# Patient Record
Sex: Female | Born: 1965 | Race: White | Hispanic: No | Marital: Single
Health system: Southern US, Community
[De-identification: ages and names within clinical notes are randomized; demographics above are authoritative.]

## PROBLEM LIST (undated history)

## (undated) DIAGNOSIS — J329 Chronic sinusitis, unspecified: Secondary | ICD-10-CM

## (undated) DIAGNOSIS — F17200 Nicotine dependence, unspecified, uncomplicated: Secondary | ICD-10-CM

## (undated) DIAGNOSIS — F419 Anxiety disorder, unspecified: Secondary | ICD-10-CM

## (undated) DIAGNOSIS — F32A Depression, unspecified: Secondary | ICD-10-CM

## (undated) DIAGNOSIS — F329 Major depressive disorder, single episode, unspecified: Secondary | ICD-10-CM

## (undated) HISTORY — PX: COLONOSCOPY: SHX174

## (undated) HISTORY — DX: Nicotine dependence, unspecified, uncomplicated: F17.200

## (undated) HISTORY — DX: Chronic sinusitis, unspecified: J32.9

## (undated) HISTORY — DX: Anxiety disorder, unspecified: F41.9

## (undated) HISTORY — DX: Major depressive disorder, single episode, unspecified: F32.9

---
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
## 2001-09-11 ENCOUNTER — Encounter: Admission: RE | Admit: 2001-09-11 | Discharge: 2001-09-11 | Payer: Self-pay | Admitting: Otolaryngology

## 2001-09-11 ENCOUNTER — Encounter: Payer: Self-pay | Admitting: Otolaryngology

## 2004-06-24 ENCOUNTER — Ambulatory Visit: Payer: Self-pay | Admitting: Internal Medicine

## 2006-02-01 ENCOUNTER — Ambulatory Visit: Payer: Self-pay | Admitting: Internal Medicine

## 2006-05-05 ENCOUNTER — Ambulatory Visit: Payer: Self-pay | Admitting: Internal Medicine

## 2006-05-05 DIAGNOSIS — F17200 Nicotine dependence, unspecified, uncomplicated: Secondary | ICD-10-CM | POA: Insufficient documentation

## 2006-05-05 DIAGNOSIS — F418 Other specified anxiety disorders: Secondary | ICD-10-CM | POA: Insufficient documentation

## 2006-05-05 HISTORY — DX: Other specified anxiety disorders: F41.8

## 2006-12-26 ENCOUNTER — Ambulatory Visit: Payer: Self-pay | Admitting: Internal Medicine

## 2006-12-26 ENCOUNTER — Telehealth: Payer: Self-pay | Admitting: Internal Medicine

## 2006-12-26 LAB — CONVERTED CEMR LAB: Rapid Strep: NEGATIVE

## 2007-01-03 ENCOUNTER — Ambulatory Visit: Payer: Self-pay | Admitting: Internal Medicine

## 2007-03-08 ENCOUNTER — Ambulatory Visit: Payer: Self-pay | Admitting: Internal Medicine

## 2007-05-04 ENCOUNTER — Telehealth: Payer: Self-pay | Admitting: Internal Medicine

## 2007-07-02 ENCOUNTER — Telehealth: Payer: Self-pay | Admitting: Internal Medicine

## 2007-07-10 ENCOUNTER — Telehealth: Payer: Self-pay | Admitting: Internal Medicine

## 2008-01-18 ENCOUNTER — Telehealth: Payer: Self-pay | Admitting: Internal Medicine

## 2008-01-18 ENCOUNTER — Ambulatory Visit: Payer: Self-pay | Admitting: Internal Medicine

## 2008-03-03 ENCOUNTER — Ambulatory Visit: Payer: Self-pay | Admitting: Family Medicine

## 2008-03-05 ENCOUNTER — Telehealth: Payer: Self-pay | Admitting: Internal Medicine

## 2011-01-04 ENCOUNTER — Ambulatory Visit (INDEPENDENT_AMBULATORY_CARE_PROVIDER_SITE_OTHER): Payer: BC Managed Care – PPO | Admitting: Family Medicine

## 2011-01-04 ENCOUNTER — Encounter: Payer: Self-pay | Admitting: Family Medicine

## 2011-01-04 VITALS — BP 138/96 | HR 80 | Temp 99.4°F | Wt 116.0 lb

## 2011-01-04 DIAGNOSIS — I10 Essential (primary) hypertension: Secondary | ICD-10-CM

## 2011-01-04 MED ORDER — LISINOPRIL-HYDROCHLOROTHIAZIDE 20-25 MG PO TABS: 1.0000 | ORAL_TABLET | Freq: Every day | ORAL | Status: DC

## 2011-01-04 NOTE — Progress Notes (Signed)
  Subjective:    Patient ID: Lori Barrett, female    DOB: 1965/12/06, 45 y.o.   MRN: [id number]  HPI Here to re-establish after living in [place] for the last 2 and 1/2 years. She feels fine but her BP has been slightly high for the past 6 months. She still smokes. She exercises regularly.    Review of Systems  Constitutional: Negative.   Respiratory: Negative.   Cardiovascular: Negative.        Objective:   Physical Exam  Constitutional: She appears well-developed and well-nourished.  Neck: No thyromegaly present.  Cardiovascular: Normal rate, regular rhythm, normal heart sounds and intact distal pulses.   Pulmonary/Chest: Effort normal and breath sounds normal.  Lymphadenopathy:    She has no cervical adenopathy.          Assessment & Plan:  Change from Enalapril to Lisinopril HCT. Advised her to quit smoking. Recheck in a month

## 2011-01-25 ENCOUNTER — Encounter: Payer: Self-pay | Admitting: Family Medicine

## 2011-01-25 ENCOUNTER — Ambulatory Visit (INDEPENDENT_AMBULATORY_CARE_PROVIDER_SITE_OTHER): Payer: BC Managed Care – PPO | Admitting: Family Medicine

## 2011-01-25 VITALS — BP 130/90 | HR 103 | Temp 98.5°F | Wt 117.0 lb

## 2011-01-25 DIAGNOSIS — F341 Dysthymic disorder: Secondary | ICD-10-CM

## 2011-01-25 DIAGNOSIS — Z23 Encounter for immunization: Secondary | ICD-10-CM

## 2011-01-25 DIAGNOSIS — I10 Essential (primary) hypertension: Secondary | ICD-10-CM

## 2011-01-25 DIAGNOSIS — F418 Other specified anxiety disorders: Secondary | ICD-10-CM

## 2011-01-25 MED ORDER — ESCITALOPRAM OXALATE 10 MG PO TABS: 10.0000 mg | ORAL_TABLET | Freq: Every day | ORAL | Status: DC

## 2011-01-25 MED ORDER — ALPRAZOLAM 0.5 MG PO TABS: 0.5000 mg | ORAL_TABLET | Freq: Three times a day (TID) | ORAL | Status: AC | PRN

## 2011-01-25 NOTE — Progress Notes (Signed)
  Subjective:    Patient ID: Lori Barrett, female    DOB: Jul 04, 1965, 45 y.o.   MRN: [id number]  HPI Here asking for help with depression and anxiety. One of her best friends was recently diagnosed with terminal cancer, and she is having a tough time dealing with this. In addition, she recently quit her job and is trying to find more work. She feels tearful and sad at times, often overwhelmed. She can't sleep but her appetite is preserved. She did well with Lexapro a few years ago.    Review of Systems  Constitutional: Negative.   Psychiatric/Behavioral: Positive for sleep disturbance, dysphoric mood and decreased concentration. Negative for confusion. The patient is nervous/anxious.        Objective:   Physical Exam  Constitutional: She appears well-developed and well-nourished.  Cardiovascular: Normal rate, regular rhythm, normal heart sounds and intact distal pulses.   Pulmonary/Chest: Effort normal and breath sounds normal.  Psychiatric: Her behavior is normal. Thought content normal.       Tearful           Assessment & Plan:  Start on daily Lexapro and use Xanax on a prn basis. Her HTN is better controlled now. Recheck in 2 weeks

## 2011-02-02 ENCOUNTER — Telehealth: Payer: Self-pay | Admitting: *Deleted

## 2011-02-02 NOTE — Telephone Encounter (Signed)
Pt feels a little better, but is still having panic attacks when she wakes up in the night.  Also, has abdominal discomfort, and nausea.  Has history of Peptic Ulcer.  Not sleeping well.  Suggestions?

## 2011-02-03 MED ORDER — OMEPRAZOLE MAGNESIUM 20 MG PO TBEC: 20.0000 mg | DELAYED_RELEASE_TABLET | Freq: Every day | ORAL | Status: DC

## 2011-02-03 NOTE — Telephone Encounter (Signed)
Try Prilosec OTC daily

## 2011-02-03 NOTE — Telephone Encounter (Signed)
Notified pt. 

## 2011-02-04 ENCOUNTER — Ambulatory Visit: Payer: BC Managed Care – PPO | Admitting: Family Medicine

## 2011-03-25 ENCOUNTER — Other Ambulatory Visit: Payer: Self-pay | Admitting: Family Medicine

## 2011-03-25 NOTE — Telephone Encounter (Signed)
Script sent e-scribe 

## 2011-03-31 ENCOUNTER — Telehealth: Payer: Self-pay

## 2011-03-31 NOTE — Telephone Encounter (Signed)
Pt states she is breaking out in hives from her buttocks down the back of her legs.  Pt states she did not use the detergent she usually uses last night in her laudry.  Pt has gotten Arm and Hammer for sensitive skin but would like to know what she can take during the day for hives. Pt has been taking Benadryl at night. Pt offered an appt but wants to wait until the beginning of next week to see if there are any improvements.    Per Dr. Clent Ridges pt can take Zyrtec bid (morning and evening) and Benadryl at night.  Pt is aware and will call on Monday to report how she is feeling.

## 2011-08-15 ENCOUNTER — Telehealth: Payer: Self-pay | Admitting: Family Medicine

## 2011-08-15 MED ORDER — LISINOPRIL-HYDROCHLOROTHIAZIDE 20-25 MG PO TABS: 1.0000 | ORAL_TABLET | Freq: Every day | ORAL | Status: DC

## 2011-08-15 NOTE — Telephone Encounter (Signed)
Refill request for Lexapro 10 mg take 1 po qd and pt last here on 01/25/11. ( also a 90 day supply )

## 2011-08-15 NOTE — Telephone Encounter (Signed)
Pt requested a 90 day supply of Lisinopril-HCTZ and I did send e-scribe.

## 2011-08-16 MED ORDER — ESCITALOPRAM OXALATE 10 MG PO TABS: 10.0000 mg | ORAL_TABLET | Freq: Every day | ORAL | Status: DC

## 2011-08-16 NOTE — Telephone Encounter (Signed)
I sent script e-scribe. 

## 2011-08-16 NOTE — Telephone Encounter (Signed)
Send in one year supply of both

## 2012-01-25 ENCOUNTER — Encounter: Payer: Self-pay | Admitting: Family

## 2012-01-25 ENCOUNTER — Ambulatory Visit (INDEPENDENT_AMBULATORY_CARE_PROVIDER_SITE_OTHER): Payer: BC Managed Care – PPO | Admitting: Family

## 2012-01-25 ENCOUNTER — Telehealth: Payer: Self-pay | Admitting: Family Medicine

## 2012-01-25 VITALS — BP 140/98 | HR 85 | Wt 126.0 lb

## 2012-01-25 DIAGNOSIS — H60399 Other infective otitis externa, unspecified ear: Secondary | ICD-10-CM

## 2012-01-25 DIAGNOSIS — I10 Essential (primary) hypertension: Secondary | ICD-10-CM

## 2012-01-25 DIAGNOSIS — H6010 Cellulitis of external ear, unspecified ear: Secondary | ICD-10-CM

## 2012-01-25 MED ORDER — DOXYCYCLINE HYCLATE 100 MG PO TABS: 100.0000 mg | ORAL_TABLET | Freq: Two times a day (BID) | ORAL | Status: DC

## 2012-01-25 NOTE — Patient Instructions (Signed)
Cellulitis Cellulitis is an infection of the skin and the tissue beneath it. The infected area is usually red and tender. Cellulitis occurs most often in the arms and lower legs.   CAUSES   Cellulitis is caused by bacteria that enter the skin through cracks or cuts in the skin. The most common types of bacteria that cause cellulitis are Staphylococcus and Streptococcus. SYMPTOMS    Redness and warmth.   Swelling.   Tenderness or pain.   Fever.  DIAGNOSIS  Your caregiver can usually determine what is wrong based on a physical exam. Blood tests may also be done. TREATMENT   Treatment usually involves taking an antibiotic medicine. HOME CARE INSTRUCTIONS    Take your antibiotics as directed. Finish them even if you start to feel better.   Keep the infected arm or leg elevated to reduce swelling.   Apply a warm cloth to the affected area up to 4 times per day to relieve pain.   Only take over-the-counter or prescription medicines for pain, discomfort, or fever as directed by your caregiver.   Keep all follow-up appointments as directed by your caregiver.  SEEK MEDICAL CARE IF:    You notice red streaks coming from the infected area.   Your red area gets larger or turns dark in color.   Your bone or joint underneath the infected area becomes painful after the skin has healed.   Your infection returns in the same area or another area.   You notice a swollen bump in the infected area.   You develop new symptoms.  SEEK IMMEDIATE MEDICAL CARE IF:    You have a fever.   You feel very sleepy.   You develop vomiting or diarrhea.   You have a general ill feeling (malaise) with muscle aches and pains.  MAKE SURE YOU:    Understand these instructions.   Will watch your condition.   Will get help right away if you are not doing well or get worse.  Document Released: 01/12/2005 Document Revised: 10/04/2011 Document Reviewed: 06/20/2011 [employer] Patient Information 2013  [employer]    

## 2012-01-25 NOTE — Progress Notes (Signed)
Subjective:    Patient ID: Lori Barrett, female    DOB: Sep 05, 1965, 46 y.o.   MRN: [id number]  HPI 46 year old white female, patient of Dr. Clent Ridges is in today with complaints or red swollen ear x2 days. She originally noticed it yesterday. It is now worse. She denies any injury or trauma to her ear. Denies any fever, muscle aches or pain, no sneezing, coughing or congestion. No drainage from the ear.   Review of Systems  Constitutional: Negative.   HENT: Positive for ear pain. Negative for nosebleeds, congestion and postnasal drip.   Respiratory: Negative.   Cardiovascular: Negative.   Skin: Negative.        Left ear red and swollen  Hematological: Negative.   Psychiatric/Behavioral: Negative.    Past Medical History  Diagnosis Date  . Depression   . Anxiety   . Sinusitis     History   Social History  . Marital Status: Single    Spouse Name: N/A    Number of Children: N/A  . Years of Education: N/A   Occupational History  . Not on file.   Social History Main Topics  . Smoking status: Current Some Day Smoker  . Smokeless tobacco: Never Used   Comment: also electronic cigarettes  . Alcohol Use: Yes  . Drug Use: No  . Sexually Active: Not on file   Other Topics Concern  . Not on file   Social History Narrative  . No narrative on file    No past surgical history on file.  Family History  Problem Relation Age of Onset  . Liver disease      Allergies  Allergen Reactions  . Shellfish Allergy     Current Outpatient Prescriptions on File Prior to Visit  Medication Sig Dispense Refill  . ALPRAZolam (XANAX) 0.5 MG tablet Take 1 tablet (0.5 mg total) by mouth 3 (three) times daily as needed for sleep or anxiety.  60 tablet  2  . escitalopram (LEXAPRO) 10 MG tablet Take 1 tablet (10 mg total) by mouth daily.  30 tablet  11  . lisinopril-hydrochlorothiazide (PRINZIDE,ZESTORETIC) 20-25 MG per tablet Take 1 tablet by mouth daily.  90 tablet  1  . Multiple Vitamin  (MULTIVITAMIN) tablet Take 1 tablet by mouth daily.        Marland Kitchen omeprazole (PRILOSEC OTC) 20 MG tablet Take 1 tablet (20 mg total) by mouth daily.  28 tablet  1    BP 140/98  Pulse 85  Wt 126 lb (57.153 kg)  SpO2 98%chart    Objective:   Physical Exam  Constitutional: She is oriented to person, place, and time. She appears well-developed and well-nourished.  HENT:  Right Ear: External ear normal.  Left Ear: External ear normal.  Nose: Nose normal.  Mouth/Throat: Oropharynx is clear and moist.  Neck: Normal range of motion. Neck supple.  Cardiovascular: Normal rate, regular rhythm and normal heart sounds.   Pulmonary/Chest: Effort normal and breath sounds normal.  Neurological: She is alert and oriented to person, place, and time.  Skin: Skin is warm and dry.       Left ear cellulitis, red, tender to touch, swollen.  Psychiatric: She has a normal mood and affect.          Assessment & Plan:  Assessment: Cellulitis of the ear lobe-uncontrolled, otalgia-uncontrolled  Plan: Doxycycline 100 mg twice a day x7 days. Drink plenty of fluids. Patient call the office if symptoms worsen or persist. Recheck a schedule, when necessary.

## 2012-01-25 NOTE — Telephone Encounter (Signed)
Caller: [facility]/Patient; Patient Name: Lori Barrett; PCP: [facility]); Best Callback Phone Number: [phone].  Patient calling about left ear swelling and redness.  States it is very hot to touch.  Onset 01/23/12; has been putting ice on it.  Tender to touch.  Afebrile.  Per ear symptoms protocol, advised appt within 4 hours; appt scheduled 01/25/12 1600 with Ms. Orvan Falconer, as PCP has no available appts within that time frame.

## 2012-02-14 ENCOUNTER — Other Ambulatory Visit: Payer: Self-pay | Admitting: Family Medicine

## 2012-05-18 ENCOUNTER — Telehealth: Payer: Self-pay | Admitting: Family Medicine

## 2012-05-18 NOTE — Telephone Encounter (Signed)
Take 1/2 of tablet a day for one week and then stop it

## 2012-05-18 NOTE — Telephone Encounter (Signed)
Pt want to come off of her escitalopram (LEXAPRO) 10 MG tablet.  Pt would like instructions on how to do this properly. Pls advise.

## 2012-05-21 NOTE — Telephone Encounter (Signed)
I spoke with pt  

## 2012-08-01 ENCOUNTER — Other Ambulatory Visit: Payer: Self-pay | Admitting: Family Medicine

## 2012-09-13 ENCOUNTER — Other Ambulatory Visit: Payer: Self-pay | Admitting: Family Medicine

## 2012-09-14 ENCOUNTER — Other Ambulatory Visit: Payer: Self-pay | Admitting: Family Medicine

## 2012-09-14 ENCOUNTER — Telehealth: Payer: Self-pay | Admitting: Family Medicine

## 2012-09-14 MED ORDER — LISINOPRIL-HYDROCHLOROTHIAZIDE 20-25 MG PO TABS: 1.0000 | ORAL_TABLET | Freq: Every day | ORAL | Status: DC

## 2012-09-14 NOTE — Telephone Encounter (Signed)
I spoke with pt and she does have a appointment on 10/03/12 here, she needed 2 scripts filled. I did send in # 30 only for the Lisinopril/HCTZ and I explained that only the provider could approve the Lexapro.

## 2012-09-14 NOTE — Telephone Encounter (Signed)
Pt has only on lexapro left

## 2012-09-14 NOTE — Telephone Encounter (Signed)
Pt did not know she needs appt for refills.  Appt made 6/18 @ 1pm. Pt would like refill today if possible.  She is out of meds after today.  lisinopril-hydrochlorothiazide (PRINZIDE,ZESTORETIC) 20-25 MG per tablet escitalopram (LEXAPRO) 10 MG tablet  [facility] [address]

## 2012-09-17 MED ORDER — ESCITALOPRAM OXALATE 10 MG PO TABS: 10.0000 mg | ORAL_TABLET | Freq: Every day | ORAL | Status: DC

## 2012-09-17 NOTE — Telephone Encounter (Signed)
I did send script e-scribe and left voice message for pt.

## 2012-09-17 NOTE — Telephone Encounter (Signed)
Refill Lexapro for 30 days

## 2012-09-18 NOTE — Telephone Encounter (Signed)
Okay for #30 of each

## 2012-10-03 ENCOUNTER — Ambulatory Visit: Payer: BC Managed Care – PPO | Admitting: Family Medicine

## 2012-10-09 ENCOUNTER — Encounter: Payer: Self-pay | Admitting: Family Medicine

## 2012-10-09 ENCOUNTER — Ambulatory Visit (INDEPENDENT_AMBULATORY_CARE_PROVIDER_SITE_OTHER): Payer: BC Managed Care – PPO | Admitting: Family Medicine

## 2012-10-09 VITALS — BP 162/98 | HR 97 | Temp 98.7°F | Wt 130.0 lb

## 2012-10-09 DIAGNOSIS — F3289 Other specified depressive episodes: Secondary | ICD-10-CM

## 2012-10-09 DIAGNOSIS — F329 Major depressive disorder, single episode, unspecified: Secondary | ICD-10-CM

## 2012-10-09 DIAGNOSIS — I10 Essential (primary) hypertension: Secondary | ICD-10-CM

## 2012-10-09 DIAGNOSIS — F411 Generalized anxiety disorder: Secondary | ICD-10-CM

## 2012-10-09 DIAGNOSIS — F17200 Nicotine dependence, unspecified, uncomplicated: Secondary | ICD-10-CM

## 2012-10-09 HISTORY — DX: Essential (primary) hypertension: I10

## 2012-10-09 MED ORDER — LISINOPRIL-HYDROCHLOROTHIAZIDE 20-25 MG PO TABS: 1.0000 | ORAL_TABLET | Freq: Every day | ORAL | Status: DC

## 2012-10-09 MED ORDER — ESCITALOPRAM OXALATE 10 MG PO TABS: 10.0000 mg | ORAL_TABLET | Freq: Every day | ORAL | Status: DC

## 2012-10-09 MED ORDER — AMLODIPINE BESYLATE 5 MG PO TABS: 5.0000 mg | ORAL_TABLET | Freq: Every day | ORAL | Status: DC

## 2012-10-09 NOTE — Progress Notes (Signed)
  Subjective:    Patient ID: Lori Barrett, female    DOB: 01-10-1966, 47 y.o.   MRN: [id number]  HPI Here to follow up. She feels fine but her BP has gone up. She has gained 13 lbs in the past 18 months. She wants to quit smoking.    Review of Systems  Constitutional: Negative.   Respiratory: Negative.   Cardiovascular: Negative.   Psychiatric/Behavioral: Negative.        Objective:   Physical Exam  Constitutional: She appears well-developed and well-nourished.  Cardiovascular: Normal rate, regular rhythm, normal heart sounds and intact distal pulses.   Pulmonary/Chest: Effort normal and breath sounds normal.  Psychiatric: She has a normal mood and affect. Her behavior is normal. Thought content normal.          Assessment & Plan:  Add Amlodidpine to her Lisinopril HCT. Use OTC nicotine patches. Recheck in one month

## 2012-10-25 ENCOUNTER — Telehealth: Payer: Self-pay | Admitting: Family Medicine

## 2012-11-07 ENCOUNTER — Ambulatory Visit: Payer: BC Managed Care – PPO | Admitting: Family Medicine

## 2013-05-02 ENCOUNTER — Other Ambulatory Visit: Payer: Self-pay | Admitting: Family Medicine

## 2013-05-02 NOTE — Telephone Encounter (Signed)
Call in #60 with 2 rf 

## 2013-09-10 ENCOUNTER — Other Ambulatory Visit: Payer: Self-pay | Admitting: Family Medicine

## 2013-09-11 ENCOUNTER — Other Ambulatory Visit: Payer: Self-pay | Admitting: Family Medicine

## 2013-09-11 DIAGNOSIS — I10 Essential (primary) hypertension: Secondary | ICD-10-CM

## 2013-09-11 NOTE — Telephone Encounter (Signed)
Per Dr. Clent Ridges order CPE labs and diagnosis code is hypertension. I spoke with pt and put future lab orders in computer.

## 2013-09-27 ENCOUNTER — Other Ambulatory Visit: Payer: BC Managed Care – PPO

## 2013-09-30 ENCOUNTER — Telehealth: Payer: Self-pay | Admitting: Family Medicine

## 2013-09-30 MED ORDER — ALPRAZOLAM 0.5 MG PO TABS: ORAL_TABLET | ORAL | Status: DC

## 2013-09-30 NOTE — Telephone Encounter (Signed)
Call in #60 with no rf 

## 2013-09-30 NOTE — Telephone Encounter (Signed)
Pt states she accidentally threw away her new rx ALPRAZolam (XANAX) 0.5 MG tablet. (just picked up from pharm)  She had been on a business trip w/long hours . Pt states she lives in an apartment complex and all the trash goes into a dumpster.  She states too much trash to go through. Would like to know if dr fry will send in a new rx for [facility] to [facility]. #60

## 2013-09-30 NOTE — Telephone Encounter (Signed)
Rx printed and faxed to the pharmacy.  LMOM @ (3:15pm) informing the pt that Dr. Markus JarvisFry okayed the refill, and a new rx will be sent to the pharmacy.  Also informed her if she has any questions she can give us a call.//AB/CMA

## 2013-09-30 NOTE — Telephone Encounter (Signed)
pls [facility] [address], not [address]  Thanks!

## 2013-10-01 ENCOUNTER — Other Ambulatory Visit (INDEPENDENT_AMBULATORY_CARE_PROVIDER_SITE_OTHER): Payer: BC Managed Care – PPO

## 2013-10-01 DIAGNOSIS — I10 Essential (primary) hypertension: Secondary | ICD-10-CM

## 2013-10-01 LAB — CBC WITH DIFFERENTIAL/PLATELET
Basophils Absolute: 0 10*3/uL (ref 0.0–0.1)
Basophils Relative: 0.3 % (ref 0.0–3.0)
EOS PCT: 0.6 % (ref 0.0–5.0)
Eosinophils Absolute: 0.1 10*3/uL (ref 0.0–0.7)
HEMATOCRIT: 41.8 % (ref 36.0–46.0)
Hemoglobin: 14.3 g/dL (ref 12.0–15.0)
LYMPHS ABS: 2.3 10*3/uL (ref 0.7–4.0)
Lymphocytes Relative: 16.5 % (ref 12.0–46.0)
MCHC: 34.2 g/dL (ref 30.0–36.0)
MCV: 95.2 fl (ref 78.0–100.0)
MONOS PCT: 7.8 % (ref 3.0–12.0)
Monocytes Absolute: 1.1 10*3/uL — ABNORMAL HIGH (ref 0.1–1.0)
NEUTROS ABS: 10.4 10*3/uL — AB (ref 1.4–7.7)
Neutrophils Relative %: 74.8 % (ref 43.0–77.0)
PLATELETS: 399 10*3/uL (ref 150.0–400.0)
RBC: 4.39 Mil/uL (ref 3.87–5.11)
RDW: 12.9 % (ref 11.5–15.5)
WBC: 13.8 10*3/uL — AB (ref 4.0–10.5)

## 2013-10-01 LAB — BASIC METABOLIC PANEL
BUN: 12 mg/dL (ref 6–23)
CALCIUM: 9.2 mg/dL (ref 8.4–10.5)
CO2: 28 meq/L (ref 19–32)
CREATININE: 0.6 mg/dL (ref 0.4–1.2)
Chloride: 97 mEq/L (ref 96–112)
GFR: 110.64 mL/min (ref >60.00)
GLUCOSE: 102 mg/dL — AB (ref 70–99)
Potassium: 3.5 mEq/L (ref 3.5–5.1)
Sodium: 132 mEq/L — ABNORMAL LOW (ref 135–145)

## 2013-10-01 LAB — HEPATIC FUNCTION PANEL
ALK PHOS: 56 U/L (ref 39–117)
ALT: 19 U/L (ref 0–35)
AST: 25 U/L (ref 0–37)
Albumin: 4.5 g/dL (ref 3.5–5.2)
BILIRUBIN DIRECT: 0.1 mg/dL (ref 0.0–0.3)
BILIRUBIN TOTAL: 0.5 mg/dL (ref 0.2–1.2)
Total Protein: 7.5 g/dL (ref 6.0–8.3)

## 2013-10-01 LAB — LIPID PANEL
CHOL/HDL RATIO: 3
Cholesterol: 168 mg/dL (ref 0–200)
HDL: 65.6 mg/dL (ref >39.00)
LDL Cholesterol: 94 mg/dL (ref 0–99)
NONHDL: 102.4
Triglycerides: 44 mg/dL (ref 0.0–149.0)
VLDL: 8.8 mg/dL (ref 0.0–40.0)

## 2013-10-01 LAB — POCT URINALYSIS DIPSTICK
Bilirubin, UA: NEGATIVE
Blood, UA: NEGATIVE
Glucose, UA: NEGATIVE
Ketones, UA: NEGATIVE
LEUKOCYTES UA: NEGATIVE
Nitrite, UA: NEGATIVE
PH UA: 7
PROTEIN UA: NEGATIVE
SPEC GRAV UA: 1.01
Urobilinogen, UA: 0.2

## 2013-10-01 LAB — TSH: TSH: 0.98 u[IU]/mL (ref 0.35–4.50)

## 2013-10-03 MED ORDER — ALPRAZOLAM 0.5 MG PO TABS: 0.5000 mg | ORAL_TABLET | Freq: Three times a day (TID) | ORAL | Status: DC | PRN

## 2013-10-03 NOTE — Telephone Encounter (Signed)
Nettie ElmSylvia [facility] [address]

## 2013-10-03 NOTE — Telephone Encounter (Signed)
Script was printed and faxed. 

## 2013-10-03 NOTE — Addendum Note (Signed)
Addended by: Aniceto BossNIMMONS, SYLVIA A on: 10/03/2013 09:41 AM   Modules accepted: Orders

## 2013-10-04 ENCOUNTER — Other Ambulatory Visit: Payer: Self-pay | Admitting: Family Medicine

## 2013-10-07 ENCOUNTER — Other Ambulatory Visit: Payer: Self-pay | Admitting: Family Medicine

## 2013-10-07 NOTE — Telephone Encounter (Signed)
Can we refill this? 

## 2013-10-08 NOTE — Telephone Encounter (Signed)
PCP out of office  hasnt been seen in over a year . Refill for 30 days  Further refills through PCP dr Clent RidgesFry  Should make appt with dr Clent RidgesFry

## 2013-10-28 ENCOUNTER — Encounter: Payer: Self-pay | Admitting: Family Medicine

## 2013-11-07 ENCOUNTER — Other Ambulatory Visit: Payer: Self-pay | Admitting: Family Medicine

## 2013-11-08 NOTE — Telephone Encounter (Signed)
Call in #30 with one refill only. She needs an OV soon

## 2013-11-08 NOTE — Telephone Encounter (Signed)
I called in script and left a voice message, advised pt to schedule a office visit.

## 2013-12-03 ENCOUNTER — Ambulatory Visit: Payer: BC Managed Care – PPO | Admitting: Family Medicine

## 2013-12-04 ENCOUNTER — Ambulatory Visit (INDEPENDENT_AMBULATORY_CARE_PROVIDER_SITE_OTHER): Payer: BC Managed Care – PPO | Admitting: Family Medicine

## 2013-12-04 ENCOUNTER — Telehealth: Payer: Self-pay | Admitting: Family Medicine

## 2013-12-04 ENCOUNTER — Encounter: Payer: Self-pay | Admitting: Family Medicine

## 2013-12-04 VITALS — BP 147/68 | HR 88 | Temp 99.6°F | Ht 63.5 in | Wt 126.0 lb

## 2013-12-04 DIAGNOSIS — I10 Essential (primary) hypertension: Secondary | ICD-10-CM

## 2013-12-04 DIAGNOSIS — F329 Major depressive disorder, single episode, unspecified: Secondary | ICD-10-CM

## 2013-12-04 DIAGNOSIS — F411 Generalized anxiety disorder: Secondary | ICD-10-CM

## 2013-12-04 DIAGNOSIS — F3289 Other specified depressive episodes: Secondary | ICD-10-CM

## 2013-12-04 MED ORDER — ALPRAZOLAM 0.5 MG PO TABS: 0.5000 mg | ORAL_TABLET | Freq: Three times a day (TID) | ORAL | Status: DC | PRN

## 2013-12-04 MED ORDER — AMLODIPINE BESYLATE 10 MG PO TABS: 10.0000 mg | ORAL_TABLET | Freq: Every day | ORAL | Status: DC

## 2013-12-04 MED ORDER — LISINOPRIL-HYDROCHLOROTHIAZIDE 20-25 MG PO TABS: ORAL_TABLET | ORAL | Status: DC

## 2013-12-04 MED ORDER — ESCITALOPRAM OXALATE 10 MG PO TABS: ORAL_TABLET | ORAL | Status: DC

## 2013-12-04 NOTE — Telephone Encounter (Signed)
Relevant patient education assigned to patient using Emmi. ° °

## 2013-12-04 NOTE — Progress Notes (Signed)
Pre visit review using our clinic review tool, if applicable. No additional management support is needed unless otherwise documented below in the visit note. 

## 2013-12-04 NOTE — Progress Notes (Signed)
   Subjective:    Patient ID: Lori Barrett, female    DOB: 01/23/1966, 48 y.o.   MRN: [id number]  HPI Here to follow up. She feels great but her systolic pressure remains a bit high.    Review of Systems  Constitutional: Negative.   Respiratory: Negative.   Cardiovascular: Negative.   Psychiatric/Behavioral: Negative.        Objective:   Physical Exam  Constitutional: She appears well-developed and well-nourished.  Cardiovascular: Normal rate, regular rhythm, normal heart sounds and intact distal pulses.   Pulmonary/Chest: Effort normal and breath sounds normal.  Psychiatric: She has a normal mood and affect. Her behavior is normal. Thought content normal.          Assessment & Plan:  We will increase the Amlodipine to 10 mg daily.

## 2014-03-04 DIAGNOSIS — Z0279 Encounter for issue of other medical certificate: Secondary | ICD-10-CM

## 2014-04-14 ENCOUNTER — Telehealth: Payer: Self-pay | Admitting: Family Medicine

## 2014-04-14 NOTE — Telephone Encounter (Signed)
Pt called to say that her new insurance is requesting that a copy of her  BMI  be faxed to them . Need to receive fax by 04/17/14      Attn Debbie B.  [employer]    Fax number [phone]

## 2014-04-14 NOTE — Telephone Encounter (Signed)
Using this height, her BMI was 21.97 on 12-04-13

## 2014-04-14 NOTE — Telephone Encounter (Signed)
I spoke with pt and explained that we have not did her height lately. She stated that she was 63.50 inches tall. Can we use this number to calculate BMI for pt ?

## 2014-04-15 NOTE — Telephone Encounter (Signed)
Patient is aware of her BMI.  Would you like for me to fax this phone note?

## 2014-04-15 NOTE — Telephone Encounter (Signed)
I left pt a voice message, she really didn't say whether or not she still needs this number faxed in, since we gave it to her. She can call back if she still needs that and we will fax.

## 2014-08-01 ENCOUNTER — Other Ambulatory Visit: Payer: Self-pay | Admitting: *Deleted

## 2014-08-01 MED ORDER — LISINOPRIL-HYDROCHLOROTHIAZIDE 20-25 MG PO TABS: ORAL_TABLET | ORAL | Status: DC

## 2014-08-01 MED ORDER — ESCITALOPRAM OXALATE 10 MG PO TABS: ORAL_TABLET | ORAL | Status: DC

## 2014-08-01 MED ORDER — AMLODIPINE BESYLATE 10 MG PO TABS: 10.0000 mg | ORAL_TABLET | Freq: Every day | ORAL | Status: DC

## 2014-12-17 ENCOUNTER — Other Ambulatory Visit: Payer: Self-pay | Admitting: Family Medicine

## 2015-05-12 ENCOUNTER — Telehealth: Payer: Self-pay | Admitting: Family Medicine

## 2015-05-12 ENCOUNTER — Ambulatory Visit (INDEPENDENT_AMBULATORY_CARE_PROVIDER_SITE_OTHER): Payer: BLUE CROSS/BLUE SHIELD | Admitting: Family Medicine

## 2015-05-12 ENCOUNTER — Encounter: Payer: Self-pay | Admitting: Family Medicine

## 2015-05-12 VITALS — BP 129/81 | HR 91 | Temp 98.4°F | Ht 63.5 in | Wt 134.0 lb

## 2015-05-12 DIAGNOSIS — J040 Acute laryngitis: Secondary | ICD-10-CM | POA: Diagnosis not present

## 2015-05-12 MED ORDER — METHYLPREDNISOLONE ACETATE 80 MG/ML IJ SUSP
120.0000 mg | Freq: Once | INTRAMUSCULAR | Status: AC
  Administered 2015-05-12: 120 mg via INTRAMUSCULAR

## 2015-05-12 NOTE — Progress Notes (Signed)
   Subjective:    Patient ID: Lori Barrett, female    DOB: 09-03-1965, 50 y.o.   MRN: [id number]  HPI Here for 3 weeks of throat irritation and hoarseness. No sinus congestion or PND. No coughing or fever. She is drinking fluids.    Review of Systems  Constitutional: Negative.   HENT: Positive for voice change. Negative for congestion, ear pain, postnasal drip, sinus pressure and sore throat.   Eyes: Negative.   Respiratory: Negative.   Cardiovascular: Negative.        Objective:   Physical Exam  Constitutional: She appears well-developed and well-nourished.  HENT:  Right Ear: External ear normal.  Left Ear: External ear normal.  Nose: Nose normal.  Mouth/Throat: Oropharynx is clear and moist.  Eyes: Conjunctivae are normal.  Neck: Neck supple. No thyromegaly present.  Cardiovascular: Normal rate, regular rhythm, normal heart sounds and intact distal pulses.   Pulmonary/Chest: Effort normal and breath sounds normal. No respiratory distress. She has no wheezes. She has no rales. She exhibits no tenderness.  Her voice is hoarse   Lymphadenopathy:    She has no cervical adenopathy.          Assessment & Plan:  Laryngitis, given a steroid shot. Recheck if not better in 2 weeks

## 2015-05-12 NOTE — Telephone Encounter (Signed)
Patient Name: Lori Barrett DOB: 03/15/1966 Initial Comment Caller states she has been Hoarse for three weeks. Nurse Assessment Nurse: Charna Elizabeth, RN, Cathy Date/Time (Eastern Time): 05/12/2015 1:54:13 PM Confirm and document reason for call. If symptomatic, describe symptoms. You must click the next button to save text entered. ---Caller states she developed hoarseness about 3 weeks ago. No severe breathing or swallowing difficulty. No fever. No sore throat. Has the patient traveled out of the country within the last 30 days? ---No Does the patient have any new or worsening symptoms? ---Yes Will a triage be completed? ---Yes Related visit to physician within the last 2 weeks? ---No Does the PT have any chronic conditions? (i.e. diabetes, asthma, etc.) ---Yes List chronic conditions. ---High Blood Pressure Did the patient indicate they were pregnant? ---No Is this a behavioral health or substance abuse call? ---No Guidelines Guideline Title Affirmed Question Affirmed Notes Hoarseness Hoarseness persists > 2 weeks Final Disposition User See PCP When Office is Open (within 3 days) Charna Elizabeth, RN, Lynden Ang Comments Scheduled for 3:45 pm today with Dr. Clent Ridges Disagree/Comply: Comply

## 2015-05-12 NOTE — Progress Notes (Signed)
Pre visit review using our clinic review tool, if applicable. No additional management support is needed unless otherwise documented below in the visit note. 

## 2015-05-12 NOTE — Addendum Note (Signed)
Addended by: Aniceto Boss A on: 05/12/2015 04:53 PM   Modules accepted: Orders

## 2015-05-12 NOTE — Telephone Encounter (Signed)
Pt is on our schedule for today to see Dr. Fry. 

## 2015-12-13 ENCOUNTER — Other Ambulatory Visit: Payer: Self-pay | Admitting: Family Medicine

## 2016-03-13 ENCOUNTER — Other Ambulatory Visit: Payer: Self-pay | Admitting: Family Medicine

## 2016-09-12 ENCOUNTER — Other Ambulatory Visit: Payer: Self-pay | Admitting: Family Medicine

## 2016-12-26 ENCOUNTER — Ambulatory Visit (INDEPENDENT_AMBULATORY_CARE_PROVIDER_SITE_OTHER): Payer: BLUE CROSS/BLUE SHIELD | Admitting: Family Medicine

## 2016-12-26 VITALS — BP 124/88 | HR 110 | Temp 99.8°F | Ht 63.5 in | Wt 134.0 lb

## 2016-12-26 DIAGNOSIS — J209 Acute bronchitis, unspecified: Secondary | ICD-10-CM | POA: Diagnosis not present

## 2016-12-26 MED ORDER — AZITHROMYCIN 250 MG PO TABS: ORAL_TABLET | ORAL | 0 refills | Status: DC

## 2016-12-26 NOTE — Patient Instructions (Signed)
WE NOW OFFER   [facility] FAST TRACK!!!  SAME DAY Appointments for ACUTE CARE  Such as: Sprains, Injuries, cuts, abrasions, rashes, muscle pain, joint pain, back pain Colds, flu, sore throats, headache, allergies, cough, fever  Ear pain, sinus and eye infections Abdominal pain, nausea, vomiting, diarrhea, upset stomach Animal/insect bites  3 Easy Ways to Schedule: Walk-In Scheduling Call in scheduling Mychart Sign-up: [email or web address]         

## 2016-12-28 ENCOUNTER — Encounter: Payer: Self-pay | Admitting: Family Medicine

## 2016-12-28 NOTE — Progress Notes (Signed)
   Subjective:    Patient ID: Lori Barrett, female Lori Barrett   DOB: 04/03/1966, 51 y.o.   MRN: [id number]  HPI Here for 4 days of PND, chest tightness, and a dry cough.    Review of Systems  Constitutional: Negative.   HENT: Negative.   Eyes: Negative.   Respiratory: Positive for cough and chest tightness. Negative for wheezing.   Cardiovascular: Negative.        Objective:   Physical Exam  Constitutional: She appears well-developed and well-nourished.  HENT:  Right Ear: External ear normal.  Left Ear: External ear normal.  Nose: Nose normal.  Mouth/Throat: Oropharynx is clear and moist.  Eyes: Conjunctivae are normal.  Neck: No thyromegaly present.  Pulmonary/Chest: Effort normal and breath sounds normal. No respiratory distress. She has no wheezes. She has no rales.  Lymphadenopathy:    She has no cervical adenopathy.          Assessment & Plan:  Bronchitis, treat with a Zpack.  Gershon CraneStephen Jordany Russett, MD

## 2016-12-29 ENCOUNTER — Telehealth: Payer: Self-pay

## 2016-12-29 NOTE — Telephone Encounter (Signed)
Pt was seen recently for URI and given abx. She is doing well but does still have some congestion and cough. She has not taken any OTC medications for her symptoms. Advised pt to try saline nasal spray, Coricidin and Delsym. Also advised her that cough may linger, but to contact office if symptoms worsen. She voiced understanding. Nothing further needed.

## 2017-01-02 ENCOUNTER — Ambulatory Visit (INDEPENDENT_AMBULATORY_CARE_PROVIDER_SITE_OTHER): Payer: BLUE CROSS/BLUE SHIELD | Admitting: Family Medicine

## 2017-01-02 ENCOUNTER — Encounter: Payer: Self-pay | Admitting: Family Medicine

## 2017-01-02 VITALS — Temp 99.2°F | Ht 63.5 in | Wt 134.0 lb

## 2017-01-02 DIAGNOSIS — J209 Acute bronchitis, unspecified: Secondary | ICD-10-CM

## 2017-01-02 MED ORDER — AMOXICILLIN-POT CLAVULANATE 875-125 MG PO TABS: 1.0000 | ORAL_TABLET | Freq: Two times a day (BID) | ORAL | 0 refills | Status: DC

## 2017-01-02 MED ORDER — METHYLPREDNISOLONE ACETATE 80 MG/ML IJ SUSP
120.0000 mg | Freq: Once | INTRAMUSCULAR | Status: AC
  Administered 2017-01-02: 120 mg via INTRAMUSCULAR

## 2017-01-02 NOTE — Addendum Note (Signed)
Addended by: Aniceto Boss A on: 01/02/2017 03:03 PM   Modules accepted: Orders

## 2017-01-02 NOTE — Patient Instructions (Signed)
WE NOW OFFER   [facility] FAST TRACK!!!  SAME DAY Appointments for ACUTE CARE  Such as: Sprains, Injuries, cuts, abrasions, rashes, muscle pain, joint pain, back pain Colds, flu, sore throats, headache, allergies, cough, fever  Ear pain, sinus and eye infections Abdominal pain, nausea, vomiting, diarrhea, upset stomach Animal/insect bites  3 Easy Ways to Schedule: Walk-In Scheduling Call in scheduling Mychart Sign-up: [email or web address]         

## 2017-01-02 NOTE — Progress Notes (Signed)
   Subjective:    Patient ID: Lori Barrett, female    DOB: 1966/04/16, 51 y.o.   MRN: [id number]  HPI Here for continued chest tightness and a dry cough. She has taken a Zpack with no improvement. Drinking fluids.    Review of Systems  Constitutional: Positive for fever.  HENT: Positive for congestion. Negative for sinus pain, sinus pressure and sore throat.   Eyes: Negative.   Respiratory: Positive for cough and chest tightness. Negative for shortness of breath.        Objective:   Physical Exam  Constitutional: She appears well-developed and well-nourished. No distress.  HENT:  Right Ear: External ear normal.  Left Ear: External ear normal.  Nose: Nose normal.  Mouth/Throat: Oropharynx is clear and moist.  Eyes: Pupils are equal, round, and reactive to light. Conjunctivae and EOM are normal.  Neck: No thyromegaly present.  Pulmonary/Chest: Effort normal. No respiratory distress. She has no rales.  Scattered rhonchi and wheezes   Lymphadenopathy:    She has no cervical adenopathy.          Assessment & Plan:  Partially treated bronchitis. Treat with Augmentin and a steroid shot.  Gershon Crane, MD

## 2017-01-06 ENCOUNTER — Telehealth: Payer: Self-pay | Admitting: Family Medicine

## 2017-01-06 NOTE — Telephone Encounter (Signed)
I left a voice message for pt with below information.  

## 2017-01-06 NOTE — Telephone Encounter (Signed)
° ° ° °  Pt said she still has the sinus infection and would like a call back

## 2017-01-06 NOTE — Telephone Encounter (Signed)
It is too soon to give up on the Augmentin. I would take this as directed at least until next week

## 2017-01-09 ENCOUNTER — Telehealth: Payer: Self-pay | Admitting: Family Medicine

## 2017-01-09 NOTE — Telephone Encounter (Signed)
° ° °  Pt call to ask if a note can be written saying that she has had a sinus infection for 2 weeks and has been advised not to fly. Would like a call back     [phone]

## 2017-01-09 NOTE — Telephone Encounter (Signed)
I left a voice message for pt to pick up note, it is up front.

## 2017-01-09 NOTE — Telephone Encounter (Signed)
This note is ready  

## 2017-05-08 ENCOUNTER — Encounter: Payer: Self-pay | Admitting: Family Medicine

## 2017-05-08 ENCOUNTER — Ambulatory Visit: Payer: BLUE CROSS/BLUE SHIELD | Admitting: Family Medicine

## 2017-05-08 VITALS — BP 124/80 | HR 81 | Temp 98.2°F | Wt 131.2 lb

## 2017-05-08 DIAGNOSIS — J209 Acute bronchitis, unspecified: Secondary | ICD-10-CM | POA: Diagnosis not present

## 2017-05-08 MED ORDER — AMOXICILLIN-POT CLAVULANATE 875-125 MG PO TABS: 1.0000 | ORAL_TABLET | Freq: Two times a day (BID) | ORAL | 0 refills | Status: DC

## 2017-05-08 NOTE — Progress Notes (Signed)
   Subjective:    Patient ID: Marquis BuggySharon D Helmuth, female    DOB: 12/09/1965, 52 y.o.   MRN: [id number]  HPI Here for 4 days of stuffy head, PND, chest congestion and coughing up yellow sputum. On Mucinex.    Review of Systems  Constitutional: Negative.   HENT: Positive for congestion, postnasal drip and sinus pressure. Negative for sinus pain and sore throat.   Respiratory: Positive for cough, chest tightness and wheezing.        Objective:   Physical Exam  Constitutional: She appears well-developed and well-nourished.  HENT:  Right Ear: External ear normal.  Left Ear: External ear normal.  Nose: Nose normal.  Mouth/Throat: Oropharynx is clear and moist.  Eyes: Conjunctivae are normal.  Neck: No thyromegaly present.  Pulmonary/Chest: Effort normal. She has no rales.  Scattered rhonchi and wheezes   Lymphadenopathy:    She has no cervical adenopathy.          Assessment & Plan:  Bronchitis, treat with Augmentin. Gershon CraneStephen Kaymen Adrian, MD

## 2017-05-25 ENCOUNTER — Telehealth: Payer: Self-pay | Admitting: Family Medicine

## 2017-05-25 NOTE — Telephone Encounter (Signed)
Pt's checking on status

## 2017-05-25 NOTE — Telephone Encounter (Signed)
Patient is calling to report she is feeling some better after treatment- but she is still coughing and feeling fatigue. Patient is requesting re treatment with antibiotic because she is flying next Thursday. [facility]   Patient is going into meeting now- she states a message may be left on her voice mail.

## 2017-05-25 NOTE — Telephone Encounter (Signed)
Copied from CRM #[id number]. Topic: Quick Communication - See Telephone Encounter >> May 25, 2017  9:22 AM Guinevere FerrariMorris, Antonique Langford E, NT wrote: CRM for notification. See Telephone encounter for: Patient called and said she was just in the office and was still not feeling any better. Pt wanted to see if something could be call in. [facility] [address]  05/25/17.

## 2017-05-25 NOTE — Telephone Encounter (Signed)
Message left for pt. To call back and discuss her symptoms.

## 2017-05-26 MED ORDER — LEVOFLOXACIN 500 MG PO TABS: 500.0000 mg | ORAL_TABLET | Freq: Every day | ORAL | 0 refills | Status: DC

## 2017-05-26 NOTE — Telephone Encounter (Signed)
Sent to PCP to advise next steps or treatment

## 2017-05-26 NOTE — Telephone Encounter (Signed)
Call in Levaquin 500 mg daily for 10 days  

## 2017-05-26 NOTE — Telephone Encounter (Signed)
Rx has been sent. Called pt and left a VM that Rx has been sent.  

## 2017-08-04 ENCOUNTER — Telehealth: Payer: Self-pay | Admitting: Family Medicine

## 2017-08-04 NOTE — Telephone Encounter (Signed)
Copied from CRM [id number]. Topic: Quick Communication - See Telephone Encounter >> Aug 04, 2017  9:47 AM Lorrine KinMcGee, Jedaiah Rathbun B, NT wrote: CRM for notification. See Telephone encounter for: 08/04/17. Patient calling and is wanting to know if she will need a red measle shot since she is going on a cruise to the [place] on [place] 2? Patient states you can leave a detail message if there is no answer. Please advise. CB#: [phone]

## 2017-08-07 NOTE — Telephone Encounter (Signed)
Called and spoke with pt. Pt advised and voiced understanding.  

## 2017-08-07 NOTE — Telephone Encounter (Signed)
As long as she got her childhood immunizations, no shot should be needed

## 2017-08-07 NOTE — Telephone Encounter (Signed)
Sent to PCP to advise 

## 2017-09-10 ENCOUNTER — Other Ambulatory Visit: Payer: Self-pay | Admitting: Family Medicine

## 2018-02-20 ENCOUNTER — Telehealth: Payer: Self-pay | Admitting: Family Medicine

## 2018-02-20 NOTE — Telephone Encounter (Signed)
Copied from CRM [id number]. Topic: Quick Communication - See Telephone Encounter >> Feb 20, 2018  1:11 PM Arlyss Gandy, NT wrote: CRM for notification. See Telephone encounter for: 02/20/18. Pt states she is taking lisinopril-hydrochlorothiazide (PRINZIDE,ZESTORETIC) 20-25 MG tablet and has heard that it causes cancer. She would like to discuss with a nurse.

## 2018-02-20 NOTE — Telephone Encounter (Signed)
Pt called to see if there has been a recall on lisinopril-HCTZ. Pt's mother read that lisinopril-HCTZ can cause cancer.  Pt advised that NT has not heard of any recall, but she could call any pharmacy and they should be able to help her.

## 2018-06-19 ENCOUNTER — Ambulatory Visit (INDEPENDENT_AMBULATORY_CARE_PROVIDER_SITE_OTHER): Payer: Self-pay | Admitting: Family Medicine

## 2018-06-19 ENCOUNTER — Encounter: Payer: Self-pay | Admitting: Family Medicine

## 2018-06-19 VITALS — BP 120/82 | HR 83 | Temp 98.8°F | Wt 131.1 lb

## 2018-06-19 DIAGNOSIS — F418 Other specified anxiety disorders: Secondary | ICD-10-CM

## 2018-06-19 DIAGNOSIS — Z0000 Encounter for general adult medical examination without abnormal findings: Secondary | ICD-10-CM

## 2018-06-19 DIAGNOSIS — I10 Essential (primary) hypertension: Secondary | ICD-10-CM

## 2018-06-19 MED ORDER — LISINOPRIL-HYDROCHLOROTHIAZIDE 20-25 MG PO TABS: 1.0000 | ORAL_TABLET | Freq: Every day | ORAL | 3 refills | Status: DC

## 2018-06-19 MED ORDER — ESCITALOPRAM OXALATE 10 MG PO TABS: 10.0000 mg | ORAL_TABLET | Freq: Every day | ORAL | 3 refills | Status: DC

## 2018-06-19 MED ORDER — AMLODIPINE BESYLATE 10 MG PO TABS: 10.0000 mg | ORAL_TABLET | Freq: Every day | ORAL | 3 refills | Status: DC

## 2018-06-19 NOTE — Progress Notes (Signed)
   Subjective:    Patient ID: Lori Barrett, female    DOB: 09/09/1965, 53 y.o.   MRN: [id number]  HPI Here to follow up on issues. She feels great. She has not had insurance for awhile and she is now in the process of getting it back. Her BP has been stable. Her anxiety is stable.    Review of Systems  Constitutional: Negative.   Respiratory: Negative.   Cardiovascular: Negative.   Neurological: Negative.   Psychiatric/Behavioral: Negative.        Objective:   Physical Exam Constitutional:      Appearance: Normal appearance.  Neck:     Musculoskeletal: Normal range of motion and neck supple.  Cardiovascular:     Rate and Rhythm: Normal rate and regular rhythm.     Pulses: Normal pulses.     Heart sounds: Normal heart sounds.  Pulmonary:     Effort: Pulmonary effort is normal.     Breath sounds: Normal breath sounds.  Neurological:     General: No focal deficit present.     Mental Status: She is alert and oriented to person, place, and time.  Psychiatric:        Mood and Affect: Mood normal.        Thought Content: Thought content normal.        Judgment: Judgment normal.           Assessment & Plan:  Her HTN and depression with anxiety are stable. Medications are refilled. She will arrange for fasting labs soon. Gershon Crane, MD

## 2018-09-17 ENCOUNTER — Ambulatory Visit (INDEPENDENT_AMBULATORY_CARE_PROVIDER_SITE_OTHER): Payer: Self-pay | Admitting: Family Medicine

## 2018-09-17 ENCOUNTER — Other Ambulatory Visit: Payer: Self-pay

## 2018-09-17 ENCOUNTER — Encounter: Payer: Self-pay | Admitting: Family Medicine

## 2018-09-17 DIAGNOSIS — F900 Attention-deficit hyperactivity disorder, predominantly inattentive type: Secondary | ICD-10-CM | POA: Insufficient documentation

## 2018-09-17 HISTORY — DX: Attention-deficit hyperactivity disorder, predominantly inattentive type: F90.0

## 2018-09-17 MED ORDER — AMPHETAMINE-DEXTROAMPHET ER 10 MG PO CP24: 10.0000 mg | ORAL_CAPSULE | Freq: Every day | ORAL | 0 refills | Status: DC

## 2018-09-17 NOTE — Progress Notes (Signed)
   Subjective:    Patient ID: Lori Barrett, female    DOB: January 09, 1966, 53 y.o.   MRN: [id number]  HPI Virtual Visit via Telephone Note  I connected with the patient on 09/17/18 at 10:30 AM EDT by telephone and verified that I am speaking with the correct person using two identifiers. We attempted to connect virtually but we had technical difficulties with the audio and video.    I discussed the limitations, risks, security and privacy concerns of performing an evaluation and management service by telephone and the availability of in person appointments. I also discussed with the patient that there may be a patient responsible charge related to this service. The patient expressed understanding and agreed to proceed.  Location patient: home Location provider: work or home office Participants present for the call: patient, provider Patient did not have a visit in the prior 7 days to address this/these issue(s).   History of Present Illness: She is asking for help with a poor attention span. She says she has always had trouble staying focused on things and this bothered her a lot when she was in school as a child. Now she finds that this is affecting her job since she does a lot on the computer now. She has trouble completing tasks and she has to write herself a lot of notes to remember to do things. Her family has noticed this also. Her anxiety and HTN have been under good control.    Observations/Objective: Patient sounds cheerful and well on the phone. I do not appreciate any SOB. Speech and thought processing are grossly intact. Patient reported vitals:  Assessment and Plan: ADHD, treat with Adderal XR 10 mg in the morning. Recheck in one month.  Gershon Crane, MD   Follow Up Instructions:     [phone] 5-10 [phone] 11-20 9443 21-30 I did not refer this patient for an OV in the next 24 hours for this/these issue(s).  I discussed the assessment and treatment plan with the patient. The  patient was provided an opportunity to ask questions and all were answered. The patient agreed with the plan and demonstrated an understanding of the instructions.   The patient was advised to call back or seek an in-person evaluation if the symptoms worsen or if the condition fails to improve as anticipated.  I provided 14 minutes of non-face-to-face time during this encounter.   Gershon Crane, MD    Review of Systems     Objective:   Physical Exam        Assessment & Plan:

## 2018-09-18 ENCOUNTER — Telehealth: Payer: Self-pay | Admitting: Family Medicine

## 2018-09-18 MED ORDER — AMPHETAMINE-DEXTROAMPHET ER 10 MG PO CP24: 10.0000 mg | ORAL_CAPSULE | Freq: Every day | ORAL | 0 refills | Status: DC

## 2018-09-18 NOTE — Telephone Encounter (Signed)
Copied from CRM [id number]. Topic: Quick Communication - Rx Refill/Question >> Sep 18, 2018 10:49 AM Elliot Gault wrote: Medication: amphetamine-dextroamphetamine (ADDERALL XR) 10 MG 24 hr capsule (the pharmacy were the Rx was sent does not have medication in stock)  (please send to new pharmacy)    Preferred Pharmacy (with phone number or street name): [facility] [address], [place], [place] [address]  Agent: Please be advised that RX refills may take up to 3 business days. We ask that you follow-up with your pharmacy.

## 2018-09-18 NOTE — Telephone Encounter (Signed)
Done

## 2018-10-15 ENCOUNTER — Telehealth: Payer: Self-pay | Admitting: Family Medicine

## 2018-10-15 NOTE — Telephone Encounter (Signed)
Dr. Fry please advise on refill. Thanks  

## 2018-10-15 NOTE — Telephone Encounter (Signed)
**  Patient states that she has 4 pills left, so she wanted to request this before she ran out because it seems to really be helping**  Medication Refill - Medication: amphetamine-dextroamphetamine (ADDERALL XR) 10 MG 24 hr capsule   Has the patient contacted their pharmacy? Yes.   (Agent: If no, request that the patient contact the pharmacy for the refill.) (Agent: If yes, when and what did the pharmacy advise?)  Preferred Pharmacy (with phone number or street name): [place], [place]  Agent: Please be advised that RX refills may take up to 3 business days. We ask that you follow-up with your pharmacy.

## 2018-10-16 MED ORDER — AMPHETAMINE-DEXTROAMPHET ER 10 MG PO CP24: 10.0000 mg | ORAL_CAPSULE | Freq: Every day | ORAL | 0 refills | Status: DC

## 2018-10-16 NOTE — Telephone Encounter (Signed)
Done for 3 months

## 2018-11-27 ENCOUNTER — Ambulatory Visit: Payer: Self-pay | Admitting: Family Medicine

## 2018-11-27 NOTE — Telephone Encounter (Signed)
Pt calling with questions regarding covid testing. States she will be going to the beach in Sept.  with friends who have special needs children. Inquiring about testing for herself and precautions. Questions answered to pts satisfaction. Advised to CB if any additional questions arise.

## 2019-01-16 ENCOUNTER — Telehealth: Payer: Self-pay | Admitting: Family Medicine

## 2019-01-16 NOTE — Telephone Encounter (Signed)
Okay for refill? Please advise 

## 2019-01-16 NOTE — Telephone Encounter (Signed)
°  Relation to pt: self  Call back number: [phone] Pharmacy:  [facility] [place], [place] [phone] (Phone) [phone] (Fax)    Reason for call:  Patient requesting amphetamine-dextroamphetamine (ADDERALL XR) 10 MG 24 hr capsule, patient states she has 1 pill left, informed please allow 48 to 72 hour turn around, patient requesting a follow up call when Rx is sent.

## 2019-01-17 MED ORDER — AMPHETAMINE-DEXTROAMPHET ER 10 MG PO CP24: 10.0000 mg | ORAL_CAPSULE | Freq: Every day | ORAL | 0 refills | Status: DC

## 2019-01-17 NOTE — Telephone Encounter (Signed)
I sent 3 refills to Lori Barrett, but I also sent 2 to [facility] by [facility]. [facility]

## 2019-01-17 NOTE — Telephone Encounter (Signed)
[facility] but was on hold for 10 minutes. Will try again later.

## 2019-01-21 ENCOUNTER — Telehealth: Payer: Self-pay | Admitting: Family Medicine

## 2019-01-21 NOTE — Telephone Encounter (Signed)
Pt says that lisinopril-hydrochlorothiazide (PRINZIDE,ZESTORETIC) 20-25 MG tablet Makes her cough and is asking for laserotone to be called in instead.  [facility] [address]  Please call back at [phone] L/m if no answer

## 2019-01-21 NOTE — Telephone Encounter (Signed)
I can certainly change the medication, but she will need an OV with me first to discuss this cough in more detail. Please set up a virtual visit

## 2019-01-21 NOTE — Telephone Encounter (Signed)
Ok for medication change?

## 2019-01-22 NOTE — Telephone Encounter (Signed)
Virtual visit scheduled for Wed 01/23/2019 at 1:45 PM

## 2019-01-23 ENCOUNTER — Encounter: Payer: Self-pay | Admitting: Family Medicine

## 2019-01-23 ENCOUNTER — Telehealth (INDEPENDENT_AMBULATORY_CARE_PROVIDER_SITE_OTHER): Payer: Self-pay | Admitting: Family Medicine

## 2019-01-23 ENCOUNTER — Other Ambulatory Visit: Payer: Self-pay

## 2019-01-23 DIAGNOSIS — I10 Essential (primary) hypertension: Secondary | ICD-10-CM

## 2019-01-23 DIAGNOSIS — T464X5A Adverse effect of angiotensin-converting-enzyme inhibitors, initial encounter: Secondary | ICD-10-CM

## 2019-01-23 DIAGNOSIS — R058 Other specified cough: Secondary | ICD-10-CM

## 2019-01-23 DIAGNOSIS — R05 Cough: Secondary | ICD-10-CM

## 2019-01-23 MED ORDER — LOSARTAN POTASSIUM-HCTZ 50-12.5 MG PO TABS: 1.0000 | ORAL_TABLET | Freq: Every day | ORAL | 3 refills | Status: DC

## 2019-01-23 NOTE — Progress Notes (Signed)
Virtual Visit via Telephone Note  I connected with the patient on 01/23/19 at  1:45 PM EDT by telephone and verified that I am speaking with the correct person using two identifiers. We attempted to connect virtually but we had technical difficulties with the audio and video.     I discussed the limitations, risks, security and privacy concerns of performing an evaluation and management service by telephone and the availability of in person appointments. I also discussed with the patient that there may be a patient responsible charge related to this service. The patient expressed understanding and agreed to proceed.  Location patient: home Location provider: work or home office Participants present for the call: patient, provider Patient did not have a visit in the prior 7 days to address this/these issue(s).   History of Present Illness: Here asking for [place] to change her BP medication. For the past month she has had a tingling dry cough that starts in her throat, and she thinks this is from the Lisinopril she takes. No headache or ST or SOB or body aches or NVD. She feels well otherwise. Her BP has been well controlled.    Observations/Objective: Patient sounds cheerful and well on the phone. I do not appreciate any SOB. Speech and thought processing are grossly intact. Patient reported vitals:  Assessment and Plan: ACE inhibitor cough. Stop the Lisinopril HCT and start on Losartan HCT 50-12.5 daily.  Alysia Penna, MD  Follow Up Instructions:     [phone] 5-10 [phone] 11-20 9443 21-30 I did not refer this patient for an OV in the next 24 hours for this/these issue(s).  I discussed the assessment and treatment plan with the patient. The patient was provided an opportunity to ask questions and all were answered. The patient agreed with the plan and demonstrated an understanding of the instructions.   The patient was advised to call back or seek an in-person evaluation if the symptoms  worsen or if the condition fails to improve as anticipated.  I provided 12 minutes of non-face-to-face time during this encounter.   Alysia Penna, MD

## 2019-01-25 ENCOUNTER — Ambulatory Visit: Payer: Self-pay

## 2019-04-16 ENCOUNTER — Telehealth: Payer: Self-pay | Admitting: Family Medicine

## 2019-04-16 MED ORDER — AMPHETAMINE-DEXTROAMPHET ER 10 MG PO CP24: 10.0000 mg | ORAL_CAPSULE | Freq: Every day | ORAL | 0 refills | Status: DC

## 2019-04-16 NOTE — Telephone Encounter (Signed)
Okay for refill?  

## 2019-04-16 NOTE — Telephone Encounter (Signed)
Noted  

## 2019-04-16 NOTE — Telephone Encounter (Signed)
Medication Refill - Medication: amphetamine-dextroamphetamine (ADDERALL XR) 10 MG 24 hr capsule  Pt has only 1 pill left   Has the patient contacted their pharmacy? Yes.   (Agent: If no, request that the patient contact the pharmacy for the refill.) (Agent: If yes, when and what did the pharmacy advise?)  Preferred Pharmacy (with phone number or street name):  [place], [place]  [place] [address]  Phone: [phone] Fax: [phone]    Agent: Please be advised that RX refills may take up to 3 business days. We ask that you follow-up with your pharmacy.

## 2019-04-16 NOTE — Telephone Encounter (Signed)
Done

## 2019-06-30 ENCOUNTER — Other Ambulatory Visit: Payer: Self-pay | Admitting: Family Medicine

## 2019-07-10 ENCOUNTER — Telehealth: Payer: Self-pay | Admitting: Family Medicine

## 2019-07-10 NOTE — Telephone Encounter (Signed)
I agree, we will have to play it by ear

## 2019-07-10 NOTE — Telephone Encounter (Signed)
Called pt daughter to set up a ov with pt. Pt daughter [name] declined and stated that pt does not know that she called. [name] wanted someone to call pt to make up a reason why pt should come in. I advised pt that I cannot lie to any pt. I advised [name] that we also cannot force pt to come in against her own will.

## 2019-07-10 NOTE — Telephone Encounter (Addendum)
Pt's mother, Lori Barrett, stated that her daughter's memory has been terrible lately. She forgot to pay her mortgage for four months but she had enough in her account to pay for the whole year. She also said she will leave the stove on and she had to let her move back in b/c she is scared she is going to mess up. Lori Barrett sold her house yesterday and gave the realtor the key and then asked her mom when is she going to bring it back. Anytime she mentions anything about her memory she gets very angry.   Lori Barrett is concerned about her memory and wonders how her daughter will be taken care of if she was to pass.   Informed her that she is not listed on the DPR and will not be able to speak to her about [place]. Pt understood but stated she would like to tell the PCP what is going on. [phone]

## 2019-07-10 NOTE — Telephone Encounter (Signed)
Message Routed to PCP for review. 

## 2019-07-17 ENCOUNTER — Telehealth: Payer: Self-pay | Admitting: Family Medicine

## 2019-07-17 NOTE — Telephone Encounter (Signed)
Pt mother [name] requested that I confirm that her documents of her being pt power of attoreny is in pt chart. I confirmed we did receive it and it is in pt chart.   Pt mother wanted to list things she needed to speak with Dr.Fry about in regards to pt.   We do have some of this information on a prior telephone encounter.   Pt seems to beconfused all the time, pt is scared to stay by herself so she moved in with her mother. She does not lock doors and forgets to set alarm. Pt is getting washer confused with dryer with how to use them. She did not pay her mortgage for 4 months and had enough money to pay for 12 months in her account, her reason to why was that she was scared.

## 2019-07-18 NOTE — Telephone Encounter (Signed)
Left message for patient to call back  

## 2019-07-18 NOTE — Telephone Encounter (Signed)
Set up an in person OV so both Lori Barrett and her mother can come in together to talk about this

## 2019-07-29 NOTE — Telephone Encounter (Signed)
Left message for patient to call back  

## 2019-08-02 NOTE — Telephone Encounter (Signed)
Left message for patient to call back. Unable to reach the patient. Message will be closed.  

## 2019-09-25 ENCOUNTER — Other Ambulatory Visit: Payer: Self-pay | Admitting: Family Medicine

## 2019-11-05 ENCOUNTER — Telehealth: Payer: Self-pay | Admitting: Family Medicine

## 2019-11-05 NOTE — Telephone Encounter (Signed)
Left a detailed message on verified voice mail letting the patient know she is overdue for an appointment and asked that she call back to schedule.

## 2019-11-05 NOTE — Telephone Encounter (Signed)
Pts mother Lori Barrett) is calling in with a concern about the pt she stated that the pt went to 4 different hair appointments that she did not have scheduled and one being on Sunday when the salon was closed.  Pts hairdresser expressed a concern about the pt to the mother and was wondering if something was going on with her.  Pt has been forgetting a lot of things and is disconnected.  Pts mother would like to see if Dr. Clent Ridges would call the pt to let her know that she has not been seen in a while to get her checked out.  Mother said that she would like to see the get help before it is to late.

## 2019-12-16 ENCOUNTER — Telehealth: Payer: Self-pay | Admitting: Family Medicine

## 2019-12-16 MED ORDER — AMPHETAMINE-DEXTROAMPHET ER 10 MG PO CP24: 10.0000 mg | ORAL_CAPSULE | Freq: Every day | ORAL | 0 refills | Status: DC

## 2019-12-16 NOTE — Telephone Encounter (Signed)
Left a detailed message on verified voice mail informing the patient that her medication has been sent in. Nothing further needed.

## 2019-12-16 NOTE — Telephone Encounter (Signed)
Last filled 06/16/2019 Last OV 01/23/2019  Ok to fill?

## 2019-12-16 NOTE — Telephone Encounter (Signed)
amphetamine-dextroamphetamine (ADDERALL XR) 10 MG 24 hr capsule(Expired)    [facility] [place], [place] - [address] Phone:  [phone]  Fax:  [phone]

## 2019-12-16 NOTE — Addendum Note (Signed)
Addended by: Gershon Crane A on: 12/16/2019 11:43 AM   Modules accepted: Orders

## 2019-12-16 NOTE — Telephone Encounter (Signed)
Done

## 2019-12-26 ENCOUNTER — Other Ambulatory Visit: Payer: Self-pay | Admitting: Family Medicine

## 2020-01-14 ENCOUNTER — Telehealth: Payer: Self-pay | Admitting: Family Medicine

## 2020-01-14 NOTE — Telephone Encounter (Signed)
Pt calling requesting a refill for: amphetamine-dextroamphetamine(adderall xr)  Send request to: [facility] - [place], [place] - [address]

## 2020-01-15 NOTE — Telephone Encounter (Addendum)
Patient needs an appointment for refill. The home/cell number message says they can't hear me when I try to leave a message - have tried 3 times.  The work # we have listed says that no one by that name works there. She does not have a MyChart account.  Last labs in Epic 2015 Had a telemed visit 01/23/19.   There is a pending order for 02/15/20.

## 2020-01-16 ENCOUNTER — Other Ambulatory Visit: Payer: Self-pay | Admitting: Family Medicine

## 2020-01-16 DIAGNOSIS — F900 Attention-deficit hyperactivity disorder, predominantly inattentive type: Secondary | ICD-10-CM

## 2020-01-16 NOTE — Telephone Encounter (Addendum)
OV 01/23/19 Labs 10/01/13  Controlled substance contract 2015.  Have tried to contact patient to schedule an appointment.  When calling her work number they said no one by that name worked there. Have had trouble trying to leave a message on her home/cell, but did leave a message today to try to schedule an appointment.

## 2020-01-16 NOTE — Telephone Encounter (Signed)
Pt needs a refill on Dextroamc-amtheter.  Patient needs this medication today- she only has 2 left.  She wants a call once it is called in.   [facility]- [facility] [place]

## 2020-01-17 NOTE — Telephone Encounter (Signed)
Another VM was left to call the office.

## 2020-01-17 NOTE — Telephone Encounter (Signed)
NO refills without an in person OV

## 2020-01-20 ENCOUNTER — Telehealth: Payer: Self-pay | Admitting: Internal Medicine

## 2020-01-20 ENCOUNTER — Encounter: Payer: Self-pay | Admitting: Family Medicine

## 2020-01-20 ENCOUNTER — Ambulatory Visit (INDEPENDENT_AMBULATORY_CARE_PROVIDER_SITE_OTHER): Payer: BC Managed Care – PPO | Admitting: Family Medicine

## 2020-01-20 ENCOUNTER — Other Ambulatory Visit: Payer: Self-pay

## 2020-01-20 VITALS — BP 150/80 | HR 94 | Temp 99.0°F | Ht 63.0 in | Wt 107.4 lb

## 2020-01-20 DIAGNOSIS — F418 Other specified anxiety disorders: Secondary | ICD-10-CM

## 2020-01-20 DIAGNOSIS — F900 Attention-deficit hyperactivity disorder, predominantly inattentive type: Secondary | ICD-10-CM | POA: Diagnosis not present

## 2020-01-20 DIAGNOSIS — I10 Essential (primary) hypertension: Secondary | ICD-10-CM | POA: Diagnosis not present

## 2020-01-20 MED ORDER — AMLODIPINE BESYLATE 10 MG PO TABS
10.0000 mg | ORAL_TABLET | Freq: Every day | ORAL | 3 refills | Status: DC
Start: 2020-01-20 — End: 2020-01-22

## 2020-01-20 MED ORDER — AMPHETAMINE-DEXTROAMPHET ER 10 MG PO CP24: 10.0000 mg | ORAL_CAPSULE | Freq: Every day | ORAL | 0 refills | Status: DC

## 2020-01-20 MED ORDER — LOSARTAN POTASSIUM-HCTZ 50-12.5 MG PO TABS: 1.0000 | ORAL_TABLET | Freq: Every day | ORAL | 3 refills | Status: DC

## 2020-01-20 MED ORDER — ESCITALOPRAM OXALATE 10 MG PO TABS
10.0000 mg | ORAL_TABLET | Freq: Every day | ORAL | 3 refills | Status: DC
Start: 2020-01-20 — End: 2020-01-22

## 2020-01-20 NOTE — Telephone Encounter (Signed)
On call: Adderall Rx was not called in today as of yet. Done.

## 2020-01-20 NOTE — Progress Notes (Signed)
   Subjective:    Patient ID: Lori Barrett, female    DOB: Jun 27, 1965, 53 y.o.   MRN: [id number]  HPI Here with her mother to follow up. Lori Barrett has been living with her mother since February, and her mother is concerned about her. She has noticed memory problems, though no confusion of disordered thinking. Misk thinks this is due to her ADHD, and in fact when I review her PDMR report, when she gets a 30 day supply of the Adderall, this lasts her 2-3 months. She admits she only takes it several days a week. She says she had some depression problems earlier this summer but these have resolved. She says her moods have been good lately and her mother agrees with this. She sleeps well and appetite is good. She has lost about 30 lbs over the past year, but she thinks this is due to eating a healthier diet. When she was living on the road she ate a lot of junk food, and now she is eating what her mother cooks which sounds much healthier.    Review of Systems  Constitutional: Negative.   Respiratory: Negative.   Cardiovascular: Negative.   Neurological: Negative.   Psychiatric/Behavioral: Positive for decreased concentration. Negative for agitation, behavioral problems, confusion, dysphoric mood, hallucinations, self-injury, sleep disturbance and suicidal ideas. The patient is hyperactive. The patient is not nervous/anxious.        Objective:   Physical Exam Constitutional:      Appearance: Normal appearance.  Cardiovascular:     Rate and Rhythm: Normal rate and regular rhythm.     Pulses: Normal pulses.     Heart sounds: Normal heart sounds.  Pulmonary:     Effort: Pulmonary effort is normal.     Breath sounds: Normal breath sounds.  Neurological:     General: No focal deficit present.     Mental Status: She is alert and oriented to person, place, and time.  Psychiatric:        Mood and Affect: Mood normal.        Behavior: Behavior normal.        Thought Content: Thought content normal.         Judgment: Judgment normal.           Assessment & Plan:  Her depression and anxiety seem to be well controlled so we will stay on her current Lexapro. Her BP is elevated today but she does not check it at home. I asked them to purchase a BP cuff to monitor this at home. They will check this twice daily for 2 weeks and then report back to [place]. I think most of her memory issues stem from inadequately treated ADHD. We will stay on the current Adderall dose, but I encoursged her to take this every day. They both agreed with this plan. Again they will report back in 2 weeks.  Gershon Crane, MD

## 2020-01-21 ENCOUNTER — Other Ambulatory Visit: Payer: Self-pay | Admitting: Family Medicine

## 2020-01-21 ENCOUNTER — Other Ambulatory Visit: Payer: Self-pay

## 2020-01-21 MED ORDER — LOSARTAN POTASSIUM-HCTZ 50-12.5 MG PO TABS: 1.0000 | ORAL_TABLET | Freq: Every day | ORAL | 3 refills | Status: DC

## 2020-01-21 NOTE — Telephone Encounter (Signed)
[facility] and [facility] [place], [facility] about the Adderall Rx sent on 01/20/20. He says it's on file not to fill until 02/15/20, sent by Dr. Posey Rea. I called the patient and asked did she have any Adderral that was sent to [facility] in September. She says she doesn't have any and never received from [facility]. She says [facility] [address] Profile updated. Advised by Dr. Clent Ridges to call the pharmacy and cancel the Adderall on file and he will take care of sending in patient's medications. [facility] and [facility] [place], he verbalized understanding.

## 2020-01-21 NOTE — Telephone Encounter (Signed)
Pt is calling in stating that she is out of her Rx amphetamine-dextroamphetamine (ADDERALL XR) 10 MG     [facility] [address] [facility] [address]  They stated that they had called the pharmacy and it was not there but someone from our told them that it was there.  Pt would like to see if she can get the medication today and have a call back.

## 2020-01-22 MED ORDER — ONE-DAILY MULTI VITAMINS PO TABS
1.0000 | ORAL_TABLET | Freq: Every day | ORAL | 3 refills | Status: DC
Start: 2020-01-22 — End: 2020-06-24

## 2020-01-22 MED ORDER — LOSARTAN POTASSIUM-HCTZ 50-12.5 MG PO TABS
1.0000 | ORAL_TABLET | Freq: Every day | ORAL | 3 refills | Status: DC
Start: 2020-01-22 — End: 2020-01-23

## 2020-01-22 MED ORDER — AMPHETAMINE-DEXTROAMPHET ER 10 MG PO CP24: 10.0000 mg | ORAL_CAPSULE | Freq: Every day | ORAL | 0 refills | Status: DC

## 2020-01-22 MED ORDER — ESCITALOPRAM OXALATE 10 MG PO TABS
10.0000 mg | ORAL_TABLET | Freq: Every day | ORAL | 3 refills | Status: DC
Start: 2020-01-22 — End: 2021-01-27

## 2020-01-22 MED ORDER — AMLODIPINE BESYLATE 10 MG PO TABS
10.0000 mg | ORAL_TABLET | Freq: Every day | ORAL | 3 refills | Status: DC
Start: 2020-01-22 — End: 2021-03-31

## 2020-01-22 NOTE — Telephone Encounter (Signed)
All of these medications will need to be sent to another pharmacy as requested by patient on yesterday. [facility] to [facility]:  [id number] [place],  - [address] AT [facility] [address] Phone:  [phone]  Fax:  [phone]

## 2020-01-22 NOTE — Addendum Note (Signed)
Addended by: Wilford Corner on: 01/22/2020 04:42 PM   Modules accepted: Orders

## 2020-01-23 ENCOUNTER — Other Ambulatory Visit: Payer: Self-pay | Admitting: Family Medicine

## 2020-01-23 MED ORDER — AMPHETAMINE-DEXTROAMPHET ER 10 MG PO CP24: 10.0000 mg | ORAL_CAPSULE | Freq: Every day | ORAL | 0 refills | Status: DC

## 2020-01-23 NOTE — Telephone Encounter (Signed)
Done for today

## 2020-01-23 NOTE — Telephone Encounter (Signed)
Patient called and says Adderall is not available for pickup until 02/15/20. I advised Dr. Clent Ridges will fix it for pickup today.

## 2020-01-23 NOTE — Addendum Note (Signed)
Addended by: Wilford Corner on: 01/23/2020 09:58 AM   Modules accepted: Orders

## 2020-01-23 NOTE — Addendum Note (Signed)
Addended by: Gershon Crane A on: 01/23/2020 10:08 AM   Modules accepted: Orders

## 2020-02-21 ENCOUNTER — Encounter: Payer: Self-pay | Admitting: Family Medicine

## 2020-02-21 ENCOUNTER — Other Ambulatory Visit: Payer: Self-pay

## 2020-02-21 ENCOUNTER — Ambulatory Visit (INDEPENDENT_AMBULATORY_CARE_PROVIDER_SITE_OTHER): Payer: BC Managed Care – PPO | Admitting: Family Medicine

## 2020-02-21 VITALS — BP 122/84 | HR 90 | Temp 98.0°F | Ht 62.0 in | Wt 110.0 lb

## 2020-02-21 DIAGNOSIS — Z0000 Encounter for general adult medical examination without abnormal findings: Secondary | ICD-10-CM

## 2020-02-21 DIAGNOSIS — R413 Other amnesia: Secondary | ICD-10-CM

## 2020-02-21 MED ORDER — AMPHETAMINE-DEXTROAMPHET ER 10 MG PO CP24: 10.0000 mg | ORAL_CAPSULE | Freq: Every day | ORAL | 0 refills | Status: DC

## 2020-02-21 NOTE — Addendum Note (Signed)
Addended by: Lerry Liner on: 02/21/2020 10:49 AM   Modules accepted: Orders

## 2020-02-21 NOTE — Progress Notes (Addendum)
   Subjective:    Patient ID: Lori Barrett, female    DOB: April 04, 1966, 54 y.o.   MRN: [id number]  HPI Here with her mother, [name] [phone], for a well exam. She has no complaints today. She has been taking Adderall daily but she does not think it has helped her attention span much. Her mother is worried about memory problems. She has received Covid and flu vaccinations.    Review of Systems  Constitutional: Negative.   HENT: Negative.   Eyes: Negative.   Respiratory: Negative.   Cardiovascular: Negative.   Gastrointestinal: Negative.   Genitourinary: Negative for decreased urine volume, difficulty urinating, dyspareunia, dysuria, enuresis, flank pain, frequency, hematuria, pelvic pain and urgency.  Musculoskeletal: Negative.   Skin: Negative.   Neurological: Negative.   Psychiatric/Behavioral: Negative.        Objective:   Physical Exam Constitutional:      General: She is not in acute distress.    Appearance: She is well-developed.  HENT:     Head: Normocephalic and atraumatic.     Right Ear: External ear normal.     Left Ear: External ear normal.     Nose: Nose normal.     Mouth/Throat:     Pharynx: No oropharyngeal exudate.  Eyes:     General: No scleral icterus.    Conjunctiva/sclera: Conjunctivae normal.     Pupils: Pupils are equal, round, and reactive to light.  Neck:     Thyroid: No thyromegaly.     Vascular: No JVD.  Cardiovascular:     Rate and Rhythm: Normal rate and regular rhythm.     Heart sounds: Normal heart sounds. No murmur heard.  No friction rub. No gallop.   Pulmonary:     Effort: Pulmonary effort is normal. No respiratory distress.     Breath sounds: Normal breath sounds. No wheezing or rales.  Chest:     Chest wall: No tenderness.  Abdominal:     General: Bowel sounds are normal. There is no distension.     Palpations: Abdomen is soft. There is no mass.     Tenderness: There is no abdominal tenderness. There is no guarding or  rebound.  Musculoskeletal:        General: No tenderness. Normal range of motion.     Cervical back: Normal range of motion and neck supple.  Lymphadenopathy:     Cervical: No cervical adenopathy.  Skin:    General: Skin is warm and dry.     Findings: No erythema or rash.  Neurological:     Mental Status: She is alert and oriented to person, place, and time.     Cranial Nerves: No cranial nerve deficit.     Motor: No abnormal muscle tone.     Coordination: Coordination normal.     Deep Tendon Reflexes: Reflexes are normal and symmetric. Reflexes normal.  Psychiatric:        Mood and Affect: Mood normal.        Behavior: Behavior normal.        Thought Content: Thought content normal.        Judgment: Judgment normal.           Assessment & Plan:  Well exam. We discussed diet and exercise. Get fasting labs. Set up her first colonoscopy. Her mother and I are concerned about a memory problem, so we will refer her to Neurology to evaluate.  Gershon Crane, MD

## 2020-02-22 LAB — BASIC METABOLIC PANEL WITH GFR
BUN: 8 mg/dL (ref 7–25)
CO2: 28 mmol/L (ref 20–32)
Calcium: 10 mg/dL (ref 8.6–10.4)
Chloride: 101 mmol/L (ref 98–110)
Creat: 0.55 mg/dL (ref 0.50–1.05)
GFR, Est African American: 122 mL/min/{1.73_m2} (ref >=60)
GFR, Est Non African American: 106 mL/min/{1.73_m2} (ref >=60)
Glucose, Bld: 98 mg/dL (ref 65–99)
Potassium: 4.4 mmol/L (ref 3.5–5.3)
Sodium: 140 mmol/L (ref 135–146)

## 2020-02-22 LAB — HEPATIC FUNCTION PANEL
AG Ratio: 1.5 (calc) (ref 1.0–2.5)
ALT: 17 U/L (ref 6–29)
AST: 20 U/L (ref 10–35)
Albumin: 4.5 g/dL (ref 3.6–5.1)
Alkaline phosphatase (APISO): 64 U/L (ref 37–153)
Bilirubin, Direct: 0.1 mg/dL (ref 0.0–0.2)
Globulin: 3 g/dL (calc) (ref 1.9–3.7)
Indirect Bilirubin: 0.4 mg/dL (calc) (ref 0.2–1.2)
Total Bilirubin: 0.5 mg/dL (ref 0.2–1.2)
Total Protein: 7.5 g/dL (ref 6.1–8.1)

## 2020-02-22 LAB — CBC WITH DIFFERENTIAL/PLATELET
Absolute Monocytes: 815 cells/uL (ref 200–950)
Basophils Absolute: 58 cells/uL (ref 0–200)
Basophils Relative: 0.6 %
Eosinophils Absolute: 58 cells/uL (ref 15–500)
Eosinophils Relative: 0.6 %
HCT: 45.2 % — ABNORMAL HIGH (ref 35.0–45.0)
Hemoglobin: 15.8 g/dL — ABNORMAL HIGH (ref 11.7–15.5)
Lymphs Abs: 2183 cells/uL (ref 850–3900)
MCH: 32.7 pg (ref 27.0–33.0)
MCHC: 35 g/dL (ref 32.0–36.0)
MCV: 93.6 fL (ref 80.0–100.0)
MPV: 8.4 fL (ref 7.5–12.5)
Monocytes Relative: 8.4 %
Neutro Abs: 6586 cells/uL (ref 1500–7800)
Neutrophils Relative %: 67.9 %
Platelets: 472 10*3/uL — ABNORMAL HIGH (ref 140–400)
RBC: 4.83 10*6/uL (ref 3.80–5.10)
RDW: 11.9 % (ref 11.0–15.0)
Total Lymphocyte: 22.5 %
WBC: 9.7 10*3/uL (ref 3.8–10.8)

## 2020-02-22 LAB — LIPID PANEL
Cholesterol: 188 mg/dL (ref <200)
HDL: 85 mg/dL (ref >=50)
LDL Cholesterol (Calc): 90 mg/dL (calc)
Non-HDL Cholesterol (Calc): 103 mg/dL (calc) (ref <130)
Total CHOL/HDL Ratio: 2.2 (calc) (ref <5.0)
Triglycerides: 45 mg/dL (ref <150)

## 2020-02-22 LAB — TSH: TSH: 1.6 mIU/L

## 2020-02-24 ENCOUNTER — Telehealth: Payer: Self-pay | Admitting: Family Medicine

## 2020-02-24 NOTE — Telephone Encounter (Signed)
Form is in PCP red folder for completion

## 2020-02-24 NOTE — Telephone Encounter (Signed)
Pt is calling in to check the status of her annual physical paperwork that she had left her at her physical time.

## 2020-02-24 NOTE — Telephone Encounter (Signed)
Pt is calling in stating that she really needs that physical form today so that she can get her insurance for next year and was told that it will be ready once they get the lab results back pt is aware once the results has been resulted they will call to let her know the form is ready.

## 2020-02-25 NOTE — Telephone Encounter (Signed)
This was completed yesterday

## 2020-02-25 NOTE — Telephone Encounter (Signed)
Pt called and is aware that it is ready for pick up and it has also been faxed.  Pt will come and pick the form up along with the confirmation that is attached.

## 2020-03-04 NOTE — Addendum Note (Signed)
Addended by: Gershon Crane A on: 03/04/2020 08:49 AM   Modules accepted: Orders

## 2020-03-18 ENCOUNTER — Telehealth: Payer: Self-pay | Admitting: Family Medicine

## 2020-03-18 MED ORDER — ALPRAZOLAM 1 MG PO TABS: ORAL_TABLET | ORAL | 0 refills | Status: DC

## 2020-03-18 NOTE — Addendum Note (Signed)
Addended by: Gershon Crane A on: 03/18/2020 05:05 PM   Modules accepted: Orders

## 2020-03-18 NOTE — Telephone Encounter (Signed)
Please review message below.  Thanks. Dm/cma  

## 2020-03-18 NOTE — Telephone Encounter (Signed)
Patient's mother called to see if Dr. Clent Ridges can call in something for the patient to take to relax her for her MRI on 12/08.  Please advise

## 2020-03-18 NOTE — Telephone Encounter (Signed)
I sent in a few Xanax for her to use

## 2020-03-19 ENCOUNTER — Other Ambulatory Visit: Payer: Self-pay | Admitting: Family Medicine

## 2020-03-20 NOTE — Telephone Encounter (Signed)
I left [name] a voicemail letting her know that a Rx was sent in & to call back with any questions.

## 2020-03-25 ENCOUNTER — Ambulatory Visit
Admission: RE | Admit: 2020-03-25 | Discharge: 2020-03-25 | Disposition: A | Discharge status: Home / Self Care | Payer: BC Managed Care – PPO | Source: Ambulatory Visit | Attending: Family Medicine | Admitting: Family Medicine

## 2020-03-25 ENCOUNTER — Other Ambulatory Visit: Payer: Self-pay

## 2020-03-25 DIAGNOSIS — R413 Other amnesia: Secondary | ICD-10-CM | POA: Diagnosis not present

## 2020-03-25 DIAGNOSIS — I6782 Cerebral ischemia: Secondary | ICD-10-CM | POA: Diagnosis not present

## 2020-03-25 DIAGNOSIS — R9082 White matter disease, unspecified: Secondary | ICD-10-CM | POA: Diagnosis not present

## 2020-03-25 DIAGNOSIS — I6389 Other cerebral infarction: Secondary | ICD-10-CM | POA: Diagnosis not present

## 2020-03-30 ENCOUNTER — Other Ambulatory Visit: Payer: Self-pay

## 2020-03-30 ENCOUNTER — Emergency Department (HOSPITAL_COMMUNITY)
Admission: EM | Admit: 2020-03-30 | Discharge: 2020-03-31 | Disposition: A | Discharge status: Home / Self Care | Payer: BC Managed Care – PPO | Attending: Emergency Medicine | Admitting: Emergency Medicine

## 2020-03-30 ENCOUNTER — Telehealth: Payer: Self-pay | Admitting: Family Medicine

## 2020-03-30 ENCOUNTER — Emergency Department (HOSPITAL_COMMUNITY): Payer: BC Managed Care – PPO

## 2020-03-30 DIAGNOSIS — R4182 Altered mental status, unspecified: Secondary | ICD-10-CM | POA: Diagnosis not present

## 2020-03-30 DIAGNOSIS — F17210 Nicotine dependence, cigarettes, uncomplicated: Secondary | ICD-10-CM | POA: Diagnosis not present

## 2020-03-30 DIAGNOSIS — F419 Anxiety disorder, unspecified: Secondary | ICD-10-CM | POA: Insufficient documentation

## 2020-03-30 DIAGNOSIS — F32A Depression, unspecified: Secondary | ICD-10-CM | POA: Diagnosis not present

## 2020-03-30 DIAGNOSIS — Z20822 Contact with and (suspected) exposure to covid-19: Secondary | ICD-10-CM | POA: Diagnosis not present

## 2020-03-30 DIAGNOSIS — R410 Disorientation, unspecified: Secondary | ICD-10-CM | POA: Diagnosis not present

## 2020-03-30 LAB — URINALYSIS, ROUTINE W REFLEX MICROSCOPIC
Bilirubin Urine: NEGATIVE
Glucose, UA: NEGATIVE mg/dL
Hgb urine dipstick: NEGATIVE
Ketones, ur: NEGATIVE mg/dL
Nitrite: NEGATIVE
Protein, ur: NEGATIVE mg/dL
Specific Gravity, Urine: 1.006 (ref 1.005–1.030)
pH: 6 (ref 5.0–8.0)

## 2020-03-30 LAB — RESP PANEL BY RT-PCR (FLU A&B, COVID) ARPGX2
Influenza A by PCR: NEGATIVE
Influenza B by PCR: NEGATIVE
SARS Coronavirus 2 by RT PCR: NEGATIVE

## 2020-03-30 LAB — COMPREHENSIVE METABOLIC PANEL
ALT: 21 U/L (ref 0–44)
AST: 20 U/L (ref 15–41)
Albumin: 4.4 g/dL (ref 3.5–5.0)
Alkaline Phosphatase: 60 U/L (ref 38–126)
Anion gap: 8 (ref 5–15)
BUN: 5 mg/dL — ABNORMAL LOW (ref 6–20)
CO2: 31 mmol/L (ref 22–32)
Calcium: 9.8 mg/dL (ref 8.9–10.3)
Chloride: 99 mmol/L (ref 98–111)
Creatinine, Ser: 0.54 mg/dL (ref 0.44–1.00)
GFR, Estimated: >60 mL/min (ref >60)
Glucose, Bld: 105 mg/dL — ABNORMAL HIGH (ref 70–99)
Potassium: 3.4 mmol/L — ABNORMAL LOW (ref 3.5–5.1)
Sodium: 138 mmol/L (ref 135–145)
Total Bilirubin: 0.3 mg/dL (ref 0.3–1.2)
Total Protein: 7.6 g/dL (ref 6.5–8.1)

## 2020-03-30 LAB — SALICYLATE LEVEL: Salicylate Lvl: <7 mg/dL — ABNORMAL LOW (ref 7.0–30.0)

## 2020-03-30 LAB — RAPID URINE DRUG SCREEN, HOSP PERFORMED
Amphetamines: NOT DETECTED
Barbiturates: NOT DETECTED
Benzodiazepines: NOT DETECTED
Cocaine: NOT DETECTED
Opiates: NOT DETECTED
Tetrahydrocannabinol: NOT DETECTED

## 2020-03-30 LAB — CBC
HCT: 45.3 % (ref 36.0–46.0)
Hemoglobin: 15.7 g/dL — ABNORMAL HIGH (ref 12.0–15.0)
MCH: 32.8 pg (ref 26.0–34.0)
MCHC: 34.7 g/dL (ref 30.0–36.0)
MCV: 94.8 fL (ref 80.0–100.0)
Platelets: 421 10*3/uL — ABNORMAL HIGH (ref 150–400)
RBC: 4.78 MIL/uL (ref 3.87–5.11)
RDW: 12.1 % (ref 11.5–15.5)
WBC: 11.1 10*3/uL — ABNORMAL HIGH (ref 4.0–10.5)
nRBC: 0 % (ref 0.0–0.2)

## 2020-03-30 LAB — ACETAMINOPHEN LEVEL: Acetaminophen (Tylenol), Serum: <10 ug/mL — ABNORMAL LOW (ref 10–30)

## 2020-03-30 LAB — TSH: TSH: 4.792 u[IU]/mL — ABNORMAL HIGH (ref 0.350–4.500)

## 2020-03-30 LAB — ETHANOL: Alcohol, Ethyl (B): <10 mg/dL (ref <10)

## 2020-03-30 MED ORDER — AMLODIPINE BESYLATE 5 MG PO TABS
10.0000 mg | ORAL_TABLET | Freq: Every day | ORAL | Status: DC
  Administered 2020-03-31: 10 mg via ORAL
  Filled 2020-03-30 (×2): qty 2

## 2020-03-30 MED ORDER — LOSARTAN POTASSIUM 50 MG PO TABS
50.0000 mg | ORAL_TABLET | Freq: Every day | ORAL | Status: DC
  Administered 2020-03-31: 50 mg via ORAL
  Filled 2020-03-30 (×2): qty 1

## 2020-03-30 MED ORDER — QUETIAPINE FUMARATE 50 MG PO TABS
100.0000 mg | ORAL_TABLET | Freq: Every day | ORAL | Status: DC
  Administered 2020-03-30: 100 mg via ORAL
  Filled 2020-03-30: qty 2

## 2020-03-30 MED ORDER — HALOPERIDOL LACTATE 5 MG/ML IJ SOLN
5.0000 mg | Freq: Two times a day (BID) | INTRAMUSCULAR | Status: DC | PRN
  Administered 2020-03-30: 5 mg via INTRAMUSCULAR
  Filled 2020-03-30: qty 1

## 2020-03-30 MED ORDER — DIPHENHYDRAMINE HCL 25 MG PO CAPS: 50.0000 mg | ORAL_CAPSULE | Freq: Four times a day (QID) | ORAL | Status: DC | PRN

## 2020-03-30 MED ORDER — HYDROCHLOROTHIAZIDE 12.5 MG PO CAPS
12.5000 mg | ORAL_CAPSULE | Freq: Every day | ORAL | Status: DC
Start: 2020-03-30 — End: 2020-03-31
  Administered 2020-03-31: 12.5 mg via ORAL
  Filled 2020-03-30 (×2): qty 1

## 2020-03-30 MED ORDER — DIPHENHYDRAMINE HCL 50 MG/ML IJ SOLN
50.0000 mg | Freq: Four times a day (QID) | INTRAMUSCULAR | Status: DC | PRN
  Administered 2020-03-30: 50 mg via INTRAMUSCULAR
  Filled 2020-03-30: qty 1

## 2020-03-30 MED ORDER — LORAZEPAM 2 MG/ML IJ SOLN
1.0000 mg | Freq: Four times a day (QID) | INTRAMUSCULAR | Status: DC | PRN
  Administered 2020-03-30 – 2020-03-31 (×2): 1 mg via INTRAMUSCULAR
  Filled 2020-03-30 (×2): qty 1

## 2020-03-30 MED ORDER — QUETIAPINE FUMARATE 50 MG PO TABS
50.0000 mg | ORAL_TABLET | Freq: Every day | ORAL | Status: DC
  Filled 2020-03-30: qty 1

## 2020-03-30 MED ORDER — HALOPERIDOL 5 MG PO TABS: 5.0000 mg | ORAL_TABLET | Freq: Two times a day (BID) | ORAL | Status: DC | PRN

## 2020-03-30 MED ORDER — LORAZEPAM 1 MG PO TABS: 1.0000 mg | ORAL_TABLET | Freq: Four times a day (QID) | ORAL | Status: DC | PRN

## 2020-03-30 MED ORDER — ESCITALOPRAM OXALATE 10 MG PO TABS
10.0000 mg | ORAL_TABLET | Freq: Every day | ORAL | Status: DC
  Administered 2020-03-30 – 2020-03-31 (×2): 10 mg via ORAL
  Filled 2020-03-30 (×2): qty 1

## 2020-03-30 NOTE — ED Notes (Signed)
Patient walked out attempting to leave unit; pt could not be redirected easily; Agitation PRN meds administered without incident; Staff was only on stand by and did not have to hold patient for administration; Pt allowed medications to be administered-Monique,RN

## 2020-03-30 NOTE — ED Notes (Signed)
Patient continues to attempt to leave unit and wandering unit; pt is pleasant in conversation with staff and able to be redirected but displays confusion on situation and time-Monique,RN

## 2020-03-30 NOTE — ED Notes (Signed)
Mother would like an update with the patient permisson

## 2020-03-30 NOTE — ED Provider Notes (Signed)
[facility]ERGENCY DEPT [facility] Emergency Department Provider Note MRN:  [id number]  Arrival date & time: 03/30/20     Chief Complaint   Suicidal ideation History of Present Illness   Lori Barrett is a 54 y.o. year-old female with a history of anxiety, depression presenting to the ED with chief complaint of suicidal ideation.  Over the weekend patient expressed that she was going to kill herself.  She explains that she said this out of anger and actually has no intention of harming herself in any way.  Denies any recent life stressors, no significant anxiety or depression, no issues with drugs or alcohol.  States that she was hungry and said this out of anger.  Brought here by mother for evaluation, who is unable to be reached at this time for further information.  Review of Systems  A complete 10 system review of systems was obtained and all systems are negative except as noted in the HPI and PMH.   Patient's Health History    Past Medical History:  Diagnosis Date  . Anxiety   . Depression   . Sinusitis     No past surgical history on file.  Family History  Problem Relation Age of Onset  . Liver disease Other     Social History   Socioeconomic History  . Marital status: Single    Spouse name: Not on file  . Number of children: Not on file  . Years of education: Not on file  . Highest education level: Not on file  Occupational History  . Not on file  Tobacco Use  . Smoking status: Current Every Day Smoker    Types: Cigarettes  . Smokeless tobacco: Never Used  . Tobacco comment: 1/2 to 1 pack per day  Substance and Sexual Activity  . Alcohol use: Yes    Alcohol/week: 0.0 standard drinks    Comment: 4 or 5 times per week  . Drug use: No  . Sexual activity: Not on file  Other Topics Concern  . Not on file  Social History Narrative  . Not on file   Social Determinants of Health   Financial Resource Strain: Not on file  Food Insecurity: Not on file   Transportation Needs: Not on file  Physical Activity: Not on file  Stress: Not on file  Social Connections: Not on file  Intimate Partner Violence: Not on file     Physical Exam   Vitals:   03/30/20 1631 03/30/20 1743  BP: (!) 141/54 (!) 158/74  Pulse: 81 87  Resp: 18 17  Temp:  98.4 F (36.9 C)  SpO2: 98% 100%    CONSTITUTIONAL: Chronically ill-appearing, NAD NEURO:  Alert and oriented x 3, no focal deficits EYES:  eyes equal and reactive ENT/NECK:  no LAD, no JVD CARDIO: Regular rate, well-perfused, normal S1 and S2 PULM:  CTAB no wheezing or rhonchi GI/GU:  normal bowel sounds, non-distended, non-tender MSK/SPINE:  No gross deformities, no edema SKIN:  no rash, atraumatic PSYCH:  Appropriate speech and behavior  *Additional and/or pertinent findings included in MDM below  Diagnostic and Interventional Summary    EKG Interpretation  Date/Time:    Ventricular Rate:    PR Interval:    QRS Duration:   QT Interval:    QTC Calculation:   R Axis:     Text Interpretation:        Labs Reviewed  COMPREHENSIVE METABOLIC PANEL - Abnormal; Notable for the following components:  Result Value   Potassium 3.4 (*)    Glucose, Bld 105 (*)    BUN 5 (*)    All other components within normal limits  SALICYLATE LEVEL - Abnormal; Notable for the following components:   Salicylate Lvl <7.0 (*)    All other components within normal limits  ACETAMINOPHEN LEVEL - Abnormal; Notable for the following components:   Acetaminophen (Tylenol), Serum <10 (*)    All other components within normal limits  CBC - Abnormal; Notable for the following components:   WBC 11.1 (*)    Hemoglobin 15.7 (*)    Platelets 421 (*)    All other components within normal limits  RESP PANEL BY RT-PCR (FLU A&B, COVID) ARPGX2  ETHANOL  RAPID URINE DRUG SCREEN, HOSP PERFORMED  TSH  RPR  URINALYSIS, ROUTINE W REFLEX MICROSCOPIC    CT HEAD WO CONTRAST  Final Result      Medications  LORazepam  (ATIVAN) tablet 1 mg ( Oral See Alternative 03/30/20 1847)    Or  LORazepam (ATIVAN) injection 1 mg (1 mg Intramuscular Given 03/30/20 1847)  QUEtiapine (SEROQUEL) tablet 100 mg (has no administration in time range)  haloperidol (HALDOL) tablet 5 mg (has no administration in time range)    Or  haloperidol lactate (HALDOL) injection 5 mg (has no administration in time range)  diphenhydrAMINE (BENADRYL) capsule 50 mg (has no administration in time range)    Or  diphenhydrAMINE (BENADRYL) injection 50 mg (has no administration in time range)     Procedures  /  Critical Care Procedures  ED Course and Medical Decision Making  I have reviewed the triage vital signs, the nursing notes, and pertinent available records from the EMR.  Listed above are laboratory and imaging tests that I personally ordered, reviewed, and interpreted and then considered in my medical decision making (see below for details).  Patient admits to saying that she was going to kill her self twice over the weekend but states that this was simply out of anger and has no real intention of doing so.  Unfortunately I am unable to reach her mother for collateral.  At this time I do not feel there is enough information to hold patient here under IVC, she is agreeable to me attempting to contact her mother for further information.  In the meantime we will consult TTS. Clinical Course as of 03/30/20 2123  Mon Mar 30, 2020  1615 Patient's mother is now at bedside, she is concerned that patient is depressed recently.  Patient is agreeable to stay and be evaluated by TTS.  Still mother gives no specifics, there are no clear red flags that would suggest patient is an imminent threat of harm to self or others and so IVC paperwork is not indicated at this time. [MB]    Clinical Course User Index [MB] Sabas Sous, MD     Patient was evaluated by TTS and there is concern that patient is confused.  She is not oriented to time or  events, she repeats herself, she is not fully participatory, occasionally walks away from evaluator.  She has intermittently threatened to hit staff.  IVC paperwork complete.  Seems to have a labile emotional state.  CT head is negative, patient is medically cleared.  Concern for depression with psychotic features, TTS recommending overnight observation.  Signed out to default provider.  Elmer Sow. Pilar Plate, MD [facility] Emergency Medicine [facility] [email or web address]  Final Clinical Impressions(s) / ED Diagnoses  ICD-10-CM   1. Depression, unspecified depression type  F32.A   2. Confusion  R41.0     ED Discharge Orders    None       Discharge Instructions Discussed with and Provided to Patient:   Discharge Instructions   None       Sabas Sous, MD 03/30/20 2123

## 2020-03-30 NOTE — ED Notes (Signed)
Patient and visitor coming up to the nurses station multiple times. Asking to remove SpO2 cable. This tech explained the need to keep it on as we'll continue to assess vital signs. Patient agitated and removed cable. Patient's visitor then asked this tech for full name. This tech provided first name only and department. Patient pacing in hall and visitor also becoming increasingly agitated.

## 2020-03-30 NOTE — ED Notes (Signed)
Pt getting TTS completed.

## 2020-03-30 NOTE — BH Assessment (Signed)
Assessment Note  Lori Barrett is an 54 y.o. female that presents this date with AMS. Patient denies any S/I, H/I or AVH. Patient is brought in by her mother Lori Barrett [phone]. Patient currently resides with her mother who provides collateral during assessment. Patient is observed to be highly agitated and renders limited history. Patient's mother reports that patient has been receiving services from her PCP [name] who advised her this date to present for a psychiatric evaluation after patient has been displaying some memory issues over the last few weeks and also made statements over the weekend that she wanted to harm herself. [name] per note of 12/1 prescribed patient xanax to assist the patient during a MRI that was ordered although patient's UDS is negative this date for all substances. Patient denies any SA issues although reports she "has a little wine once in a while" although cannot recall last use. Patient is not oriented and cannot recall current president, date, year or name of hospital. As this writer attempts to gather more history patient becomes upset and leaves the room. While patient is out of the room her mother states that patient "hasn't been sleeping more than a couple hours a night" for the last two weeks. Mother also reports that patient had been making statements of self harm over the weekend and "acting bizarre." Patient returns to the room and is very angry stating "she is ready to go" although this writer was able to redirect her. Patient denies she made any statements of self harm over the weekend. Patient denies any prior mental health history although states she was diagnosed with ADHD "years ago". Patient reports she is prescribed medication for that disorder although "she never takes it." Patient denies any prior attempts or gestures at self harm. Patient denies any prior inpatient hospital admissions associated with mental health. Patient does report ongoing agoraphobia  stating she "never leaves the house" due to "catching that COVID." Patient denies any history of abuse or access to firearms. Patient does agree to having blood work completed that she on admission declined. Patient will not answer any further questions associated with the assessment and once again leaves the room and could not be located for a short period of time. Case was discussed with [name] who agreed to initiate a IVC. Case was staffed with [facility] NP who recommended patient be observed and monitored. Patient is not oriented and speaks in a loud pressured voice. Patient's memory is recent impaired thoughts disorganized. Patient does not appear to be responding to internal stimuli. Overnight observation is recommended as IVC was initiated.              Diagnosis: Altered mental status   Past Medical History:  Past Medical History:  Diagnosis Date   Anxiety    Depression    Sinusitis     No past surgical history on file.  Family History:  Family History  Problem Relation Age of Onset   Liver disease Other     Social History:  reports that she has been smoking cigarettes. She has never used smokeless tobacco. She reports current alcohol use. She reports that she does not use drugs.  Additional Social History:  Alcohol / Drug Use Pain Medications: See MAR Prescriptions: See MAR Over the Counter: See MAR History of alcohol / drug use?: Yes Longest period of sobriety (when/how long): Unknown Negative Consequences of Use:  (Denies) Withdrawal Symptoms:  (Denies) Substance #1 Name of Substance 1: Alcohol 1 -  Age of First Use: 16 1 - Amount (size/oz): Varies 1 - Frequency: Varies 1 - Duration: Ongoing 1 - Last Use / Amount: Pt cannot recall states "a while back, some wine"  CIWA: CIWA-Ar BP: (!) 158/74 Pulse Rate: 87 COWS:    Allergies:  Allergies  Allergen Reactions   Lisinopril Cough   Shellfish Allergy     Home Medications: (Not in a hospital  admission)   OB/GYN Status:  No LMP recorded. (Menstrual status: Other).  General Assessment Data Location of Assessment: [facility] ED TTS Assessment: In system Is this a Tele or Face-to-Face Assessment?: Face-to-Face Is this an Initial Assessment or a Re-assessment for this encounter?: Initial Assessment Patient Accompanied by:: Parent Language Other than English: No Living Arrangements: Other (Comment) (With mother) What gender do you identify as?: Female Date Telepsych consult ordered in [facility]: 03/30/20 Marital status: Single Maiden name: Mickelsen Pregnancy Status: No Living Arrangements: Parent Can pt return to current living arrangement?: Yes Admission Status: Involuntary Petitioner: ED Attending Is patient capable of signing voluntary admission?: No Referral Source: Self/Family/Friend Insurance type: Tax adviser Exam [facility] Walk-in ONLY) Medical Exam completed: Yes  Crisis Care Plan Living Arrangements: Parent Name of Psychiatrist: None Name of Therapist: None  Education Status Is patient currently in school?: No Is the patient employed, unemployed or receiving disability?: Employed  Risk to self with the past 6 months Suicidal Ideation: No Has patient been a risk to self within the past 6 months prior to admission? : No Suicidal Intent: No Has patient had any suicidal intent within the past 6 months prior to admission? : No Is patient at risk for suicide?: No Suicidal Plan?: No Has patient had any suicidal plan within the past 6 months prior to admission? : No Access to Means: No What has been your use of drugs/alcohol within the last 12 months?: Current use Previous Attempts/Gestures: No How many times?: 0 Other Self Harm Risks: NA Triggers for Past Attempts:  (NA) Intentional Self Injurious Behavior:  (NA) Family Suicide History: No Recent stressful life event(s):  (UTA) Persecutory voices/beliefs?:  (No) Depression: No Depression Symptoms:  (Pt  denies) Substance abuse history and/or treatment for substance abuse?: No Suicide prevention information given to non-admitted patients: Not applicable  Risk to Others within the past 6 months Homicidal Ideation: No Does patient have any lifetime risk of violence toward others beyond the six months prior to admission? : No Thoughts of Harm to Others: No Current Homicidal Intent: No Current Homicidal Plan: No Access to Homicidal Means: No Identified Victim: NA History of harm to others?: No Assessment of Violence: None Noted Violent Behavior Description: NA Does patient have access to weapons?: No Criminal Charges Pending?: No Does patient have a court date: No Is patient on probation?: No  Psychosis Hallucinations: None noted Delusions: None noted  Mental Status Report Appearance/Hygiene: Bizarre Eye Contact: Poor Motor Activity: Agitation Speech: Rapid,Pressured Level of Consciousness: Irritable Mood: Anxious Affect: Angry Anxiety Level: Severe Thought Processes: Tangential Judgement: Impaired Orientation: Not oriented Obsessive Compulsive Thoughts/Behaviors: None  Cognitive Functioning Concentration: Decreased Memory: Recent Impaired Is patient IDD: No Insight: Poor Impulse Control: Poor Appetite: Fair Have you had any weight changes? : No Change Sleep: Decreased Total Hours of Sleep: 4 Vegetative Symptoms: None  ADLScreening [facility] Assessment Services) Patient's cognitive ability adequate to safely complete daily activities?: Yes Patient able to express need for assistance with ADLs?: Yes Independently performs ADLs?: Yes (appropriate for developmental age)  Prior Inpatient Therapy Prior Inpatient Therapy:  No  Prior Outpatient Therapy Prior Outpatient Therapy: No Does patient have an ACCT team?: No Does patient have Intensive In-House Services?  : No Does patient have [facility] services? : No Does patient have P4CC services?: No  ADL Screening  (condition at time of admission) Patient's cognitive ability adequate to safely complete daily activities?: Yes Is the patient deaf or have difficulty hearing?: No Does the patient have difficulty seeing, even when wearing glasses/contacts?: No Does the patient have difficulty concentrating, remembering, or making decisions?: Yes Patient able to express need for assistance with ADLs?: Yes Does the patient have difficulty dressing or bathing?: No Independently performs ADLs?: Yes (appropriate for developmental age) Does the patient have difficulty walking or climbing stairs?: No Weakness of Legs: None Weakness of Arms/Hands: None  Home Assistive Devices/Equipment Home Assistive Devices/Equipment: None  Therapy Consults (therapy consults require a physician order) PT Evaluation Needed: No OT Evalulation Needed: No SLP Evaluation Needed: No Abuse/Neglect Assessment (Assessment to be complete while patient is alone) Abuse/Neglect Assessment Can Be Completed: Yes Physical Abuse: Denies Verbal Abuse: Denies Sexual Abuse: Denies Exploitation of patient/patient's resources: Denies Self-Neglect: Denies Values / Beliefs Cultural Requests During Hospitalization: None Spiritual Requests During Hospitalization: None Consults Spiritual Care Consult Needed: No Transition of Care Team Consult Needed: No Advance Directives (For Healthcare) Does Patient Have a Medical Advance Directive?: No Would patient like information on creating a medical advance directive?: No - Patient declined          Disposition: Patient does not appear to be responding to internal stimuli. Overnight observation is recommended as IVC was initiated.  Disposition Initial Assessment Completed for this Encounter: Yes  On Site Evaluation by:   Reviewed with Physician:    Alfredia Ferguson 03/30/2020 6:24 PM

## 2020-03-30 NOTE — BH Assessment (Signed)
Overnight observation is recommended as IVC was initiated.

## 2020-03-30 NOTE — ED Triage Notes (Signed)
Pt brought in by mother for threatening to kill herself, once on Saturday and once on Sunday. Pt denies SI/HI and sts she was just mad but would never try to kill herself.

## 2020-03-30 NOTE — ED Notes (Signed)
This RN went to stick pt for blood work. Pt verbally agreed to have blood work drawn. Pt kept moving, telling this RN to stick the back of her forearm, this RN explained why that could not happen. When this RN was trying to look at the patients arm the pt tried to swat this RN, this RN told the pt that if she did hit this RN it was going to be a problem, that it was a felony, and that this RN would press charges. Pt stopped moving and agreed to hold still.

## 2020-03-30 NOTE — ED Notes (Signed)
Patient transported to CT with sitter and security-Monique,RN

## 2020-03-30 NOTE — ED Notes (Signed)
Security continues to be at bedside. Pt extreme flight risk.

## 2020-03-30 NOTE — ED Notes (Signed)
Pt refused lab draw, asked multiple times why labs were needed and that she was ready to go. Staff tried to explain to the pt why labs were needed. The pt kept moving her arm and taking off the tourniquet. Pt stated that "I'm going to pop you on the head" while writer attempted to get labs, RN notified.

## 2020-03-30 NOTE — Telephone Encounter (Signed)
Patient mother is calling and wanted to see if provider can refer patient to see a neurologist, please advise. CB is [phone]

## 2020-03-30 NOTE — ED Notes (Signed)
Pt increasingly agitated, pt refusing to get changed. Mother had to be escorted off property due to increasing agitation and aggression, mother took all valuables with her. Security and [employer] to bedside. Pt tried to leave, stopped by security. Pt taken to restroom, had to be changed and put in purple scrubs. Pt continually argumentative and refusing to following instructions. Pt with repetitive questions, disheveled hair, pressured speech.

## 2020-03-31 DIAGNOSIS — R410 Disorientation, unspecified: Secondary | ICD-10-CM | POA: Diagnosis present

## 2020-03-31 LAB — RPR: RPR Ser Ql: NONREACTIVE

## 2020-03-31 MED ORDER — QUETIAPINE FUMARATE 100 MG PO TABS: 100.0000 mg | ORAL_TABLET | Freq: Every day | ORAL | 0 refills | Status: DC

## 2020-03-31 NOTE — ED Notes (Signed)
Personal belongings has been returned to pt & she has dressed herself out of hospital scrubs.

## 2020-03-31 NOTE — ED Provider Notes (Signed)
Patient has been cleared by psych for discharge home.  Will need to follow-up recommendation for neurology.  Vitals are stable and no acute changes.  Patient will be discharged.   Rozelle Logan, DO 03/31/20 1506

## 2020-03-31 NOTE — ED Notes (Signed)
Pt's mother has been called & informed she was cleared for D/C & the pt's mother said "I'll be there later" & hung up the phone. This RN will follow up with her to find out a ETA.

## 2020-03-31 NOTE — ED Notes (Signed)
TTS in use at this time. 

## 2020-03-31 NOTE — ED Notes (Signed)
Lunch Tray Ordered @ 1048. °

## 2020-03-31 NOTE — Progress Notes (Deleted)
Disposition pending collateral from mother [name] [phone]. Voicemail left.

## 2020-03-31 NOTE — ED Notes (Signed)
IVC has been rescinded & has been faxed, confirmation fax has been received by this RN.

## 2020-03-31 NOTE — Progress Notes (Signed)
CSW received message from disposition CSW stating the patient needs a referral to see a neurologist. CSW completed chart review - the patient's PCP sent a referral on 02/21/20 and also the patient's mother called the PCP's office yesterday to request another referral.  No TOC intervention at this time.  Edwin Dada, MSW, LCSW-A Transitions of Care  Clinical Social Worker [facility] Emergency Departments  Medical ICU [phone]

## 2020-03-31 NOTE — ED Notes (Signed)
Patient continues to try come out the room and leave; pt asking for her phone and shoes; Patient is redirected back to room and get's verbally aggressive with staff; PRN med administered without incident-Monique,RN

## 2020-03-31 NOTE — Consult Note (Addendum)
Telepsych Consultation   Location of Patient: [facility] Location of Provider: [facility]  Patient Identification: Lori Barrett MRN:  [id number] Principal Diagnosis: Confusion Diagnosis:  Principal Problem:   Confusion Active Problems:   Memory loss   Total Time spent with patient: 1 hour  HPI:  Reassessment: Patient seen via telepsych. Chart reviewed. Lori Barrett is a 54 year old female with history of depression who presented to [facility] on 03/30/20 with her mother due to altered mental status, with reports of memory issues over the last few weeks and statements over the weekend that she wanted to kill herself. She denied SI/HI/AVH on admission but was agitated in the ED, requiring IM medications yesterday.  On assessment this morning, patient is calm and cooperative but mildly irritable. She shows normal rate of speech and volume. When asked what brought her to the hospital, she states "I've got no idea." When mother's concerns about memory issues are mentioned, she states that family has been telling her she has memory issues since she was a child. She is oriented to person, year, and place. She states the month is November but then states she needs a calendar to know the month. She is easily reoriented to month. She is able to say the current president. She denies any SI/HI/AVH. She shows no signs of responding to internal stimuli. No paranoid or delusional thought content expressed.  She does admit to making comments to her mother this weekend that she wanted to kill herself but states that these comments were made out of anger- "I was b.s.'ing." She denies ever having a plan or intent to kill herself. She states that she made these comments because she was tired of social isolation and wearing a mask with the pandemic, but again denies ever having a plan or intent and denies current SI. Per notes patient had been agitated in the ED and threatened to hurt staff yesterday. When asked  about this, patient admits to making threatening statements but states that she just wanted to leave the hospital and wanted staff to leave her alone. She denies any plan or intent to actually hurt anyone. Prior notes indicate that mother had stated she was not sleeping well at home. Patient denies problems with sleep. She was started on Seroquel last night and reports sleeping well through the night.  Per PDMP review, patient was prescribed Xanax tablets which were filled on 03/18/20. UDS is negative for BZDs. I asked patient about her pattern of taking Xanax, due to concern that withdrawal symptoms may have contributed to altered mental status presentation yesterday. She states that she is unaware of a Xanax prescription and was not aware of taking this medication. She states that she lives with her mother and admits to conflict with her mother.   Collateral from mother [name] [phone]: Lori Barrett states patient has a history of memory problems for months. She states she has been taking her to her PCP repeatedly for the memory problems "but they won't take me seriously." She states patient did make suicidal comments this weekend, but Lori Barrett does not believe she was serious about SI. She denies history of any suicide attempts or self-injurious behaviors. She states that she holds all of the patient's medications but is unsure if or how patient has been taking recent Xanax prescription. She claims that she was told by ED staff yesterday that patient would be held for 7 days and seen by a neurologist. Lori Barrett was informed that the patient does  not meet criteria for psychiatric hospitalization currently but that patient likely needed to be seen outpatient to evaluate for dementia. [name] also informed patient was started on Seroquel to assist with mood instability and sleep.   Per TTS assessment 03/30/20: Lori Barrett is an 54 y.o. female that presents this date with AMS. Patient  denies any S/I, H/I or AVH. Patient is brought in by her mother Jenne CampusLee Crawford [phone]. Patient currently resides with her mother who provides collateral during assessment. Patient is observed to be highly agitated and renders limited history. Patient's mother reports that patient has been receiving services from her PCP [name] who advised her this date to present for a psychiatric evaluation after patient has been displaying some memory issues over the last few weeks and also made statements over the weekend that she wanted to harm herself. [name] per note of 12/1 prescribed patient xanax to assist the patient during a MRI that was ordered although patient's UDS is negative this date for all substances. Patient denies any SA issues although reports she "has a little wine once in a while" although cannot recall last use. Patient is not oriented and cannot recall current president, date, year or name of hospital. As this writer attempts to gather more history patient becomes upset and leaves the room. While patient is out of the room her mother states that patient "hasn't been sleeping more than a couple hours a night" for the last two weeks. Mother also reports that patient had been making statements of self harm over the weekend and "acting bizarre." Patient returns to the room and is very angry stating "she is ready to go" although this writer was able to redirect her. Patient denies she made any statements of self harm over the weekend. Patient denies any prior mental health history although states she was diagnosed with ADHD "years ago". Patient reports she is prescribed medication for that disorder although "she never takes it." Patient denies any prior attempts or gestures at self harm. Patient denies any prior inpatient hospital admissions associated with mental health. Patient does report ongoing agoraphobia stating she "never leaves the house" due to "catching that COVID." Patient denies any history of  abuse or access to firearms. Patient does agree to having blood work completed that she on admission declined. Patient will not answer any further questions associated with the assessment and once again leaves the room and could not be located for a short period of time. Case was discussed with Pilar PlateBero MD who agreed to initiate a IVC. Case [facility] who recommended patient be observed and monitored. Patient is not oriented and speaks in a loud pressured voice. Patient's memory is recent impaired thoughts disorganized. Patient does not appear to be responding to internal stimuli. Overnight observation is recommended as IVC was initiated.              Disposition: Patient with ongoing memory issues for several months now. She shows no evidence of acute risk of harm to self or others and is psych cleared for discharge. [facility]eroquel rx sent [facility] on [address]. CSW consulted for referrals for neurology. ED staff updated.   Past Psychiatric History: History of depression, currently taking Lexapro. Denies prior hospitalizations or suicide attempts.  Risk to Self: Suicidal Ideation: No Suicidal Intent: No Is patient at risk for suicide?: No Suicidal Plan?: No Access to Means: No What has been your use of drugs/alcohol within the last 12 months?: Current use  How many times?: 0 Other Self Harm Risks: NA Triggers for Past Attempts:  (NA) Intentional Self Injurious Behavior:  (NA) Risk to Others: Homicidal Ideation: No Thoughts of Harm to Others: No Current Homicidal Intent: No Current Homicidal Plan: No Access to Homicidal Means: No Identified Victim: NA History of harm to others?: No Assessment of Violence: None Noted Violent Behavior Description: NA Does patient have access to weapons?: No Criminal Charges Pending?: No Does patient have a court date: No Prior Inpatient Therapy: Prior Inpatient Therapy: No Prior Outpatient Therapy: Prior Outpatient Therapy: No Does patient have an  ACCT team?: No Does patient have Intensive In-House Services?  : No Does patient have [facility] services? : No Does patient have P4CC services?: No  Past Medical History:  Past Medical History:  Diagnosis Date  . Anxiety   . Depression   . Sinusitis    No past surgical history on file. Family History:  Family History  Problem Relation Age of Onset  . Liver disease Other    Family Psychiatric  History: Unknown Social History:  Social History   Substance and Sexual Activity  Alcohol Use Yes  . Alcohol/week: 0.0 standard drinks   Comment: 4 or 5 times per week     Social History   Substance and Sexual Activity  Drug Use No    Social History   Socioeconomic History  . Marital status: Single    Spouse name: Not on file  . Number of children: Not on file  . Years of education: Not on file  . Highest education level: Not on file  Occupational History  . Not on file  Tobacco Use  . Smoking status: Current Every Day Smoker    Types: Cigarettes  . Smokeless tobacco: Never Used  . Tobacco comment: 1/2 to 1 pack per day  Substance and Sexual Activity  . Alcohol use: Yes    Alcohol/week: 0.0 standard drinks    Comment: 4 or 5 times per week  . Drug use: No  . Sexual activity: Not on file  Other Topics Concern  . Not on file  Social History Narrative  . Not on file   Social Determinants of Health   Financial Resource Strain: Not on file  Food Insecurity: Not on file  Transportation Needs: Not on file  Physical Activity: Not on file  Stress: Not on file  Social Connections: Not on file   Additional Social History:    Allergies:   Allergies  Allergen Reactions  . Lisinopril Cough  . Shellfish Allergy Other (See Comments)    Unknown reaction    Labs:  Results for orders placed or performed during the hospital encounter of 03/30/20 (from the past 48 hour(s))  Rapid urine drug screen (hospital performed)     Status: None   Collection Time: 03/30/20  3:30 PM   Result Value Ref Range   Opiates NONE DETECTED NONE DETECTED   Cocaine NONE DETECTED NONE DETECTED   Benzodiazepines NONE DETECTED NONE DETECTED   Amphetamines NONE DETECTED NONE DETECTED   Tetrahydrocannabinol NONE DETECTED NONE DETECTED   Barbiturates NONE DETECTED NONE DETECTED    Comment: (NOTE) DRUG SCREEN FOR MEDICAL PURPOSES ONLY.  IF CONFIRMATION IS NEEDED FOR ANY PURPOSE, NOTIFY LAB WITHIN 5 DAYS.  LOWEST DETECTABLE LIMITS FOR URINE DRUG SCREEN Drug Class                     Cutoff (ng/mL) Amphetamine and metabolites    1000 Barbiturate and  metabolites    200 Benzodiazepine                 200 Tricyclics and metabolites     300 Opiates and metabolites        300 Cocaine and metabolites        300 THC                            50 Performed at [facility], [address], [place], [place] [address]   Comprehensive metabolic panel     Status: Abnormal   Collection Time: 03/30/20  5:38 PM  Result Value Ref Range   Sodium 138 135 - 145 mmol/L   Potassium 3.4 (L) 3.5 - 5.1 mmol/L   Chloride 99 98 - 111 mmol/L   CO2 31 22 - 32 mmol/L   Glucose, Bld 105 (H) 70 - 99 mg/dL    Comment: Glucose reference range applies only to samples taken after fasting for at least 8 hours.   BUN 5 (L) 6 - 20 mg/dL   Creatinine, Ser 6.04 0.44 - 1.00 mg/dL   Calcium 9.8 8.9 - 54.0 mg/dL   Total Protein 7.6 6.5 - 8.1 g/dL   Albumin 4.4 3.5 - 5.0 g/dL   AST 20 15 - 41 U/L   ALT 21 0 - 44 U/L   Alkaline Phosphatase 60 38 - 126 U/L   Total Bilirubin 0.3 0.3 - 1.2 mg/dL   GFR, Estimated >98 >11 mL/min    Comment: (NOTE) Calculated using the CKD-EPI Creatinine Equation (2021)    Anion gap 8 5 - 15    Comment: Performed at [facility], [address] [place], [place] [address]  Ethanol     Status: None   Collection Time: 03/30/20  5:38 PM  Result Value Ref Range   Alcohol, Ethyl (B) <10 <10 mg/dL    Comment: (NOTE) Lowest detectable limit for serum alcohol is 10  mg/dL.  For medical purposes only. Performed at [facility], [address], [place], [place] [address]   Salicylate level     Status: Abnormal   Collection Time: 03/30/20  5:38 PM  Result Value Ref Range   Salicylate Lvl <7.0 (L) 7.0 - 30.0 mg/dL    Comment: Performed at [facility], [address], [place], [place] [address]  Acetaminophen level     Status: Abnormal   Collection Time: 03/30/20  5:38 PM  Result Value Ref Range   Acetaminophen (Tylenol), Serum <10 (L) 10 - 30 ug/mL    Comment: (NOTE) Therapeutic concentrations vary significantly. A range of 10-30 ug/mL  may be an effective concentration for many patients. However, some  are best treated at concentrations outside of this range. Acetaminophen concentrations >150 ug/mL at 4 hours after ingestion  and >50 ug/mL at 12 hours after ingestion are often associated with  toxic reactions.  Performed at [facility], [address] [place], [place] [address]   cbc     Status: Abnormal   Collection Time: 03/30/20  5:38 PM  Result Value Ref Range   WBC 11.1 (H) 4.0 - 10.5 K/uL   RBC 4.78 3.87 - 5.11 MIL/uL   Hemoglobin 15.7 (H) 12.0 - 15.0 g/dL   HCT 96.2 95.2 - 84.1 %   MCV 94.8 80.0 - 100.0 fL   MCH 32.8 26.0 - 34.0 pg   MCHC 34.7 30.0 -  36.0 g/dL   RDW 54.6 50.3 - 54.6 %   Platelets 421 (H) 150 - 400 K/uL   nRBC 0.0 0.0 - 0.2 %    Comment: Performed at [facility], [address], [place], [place] [address]  Resp Panel by RT-PCR (Flu A&B, Covid) Nasopharyngeal Swab     Status: None   Collection Time: 03/30/20  6:53 PM   Specimen: Nasopharyngeal Swab; Nasopharyngeal(NP) swabs in vial transport medium  Result Value Ref Range   SARS Coronavirus 2 by RT PCR NEGATIVE NEGATIVE    Comment: (NOTE) SARS-CoV-2 target nucleic acids are NOT DETECTED.  The SARS-CoV-2 RNA is generally detectable in upper respiratory specimens during the acute phase of infection. The lowest concentration of  SARS-CoV-2 viral copies this assay can detect is 138 copies/mL. A negative result does not preclude SARS-Cov-2 infection and should not be used as the sole basis for treatment or other patient management decisions. A negative result may occur with  improper specimen collection/handling, submission of specimen other than nasopharyngeal swab, presence of viral mutation(s) within the areas targeted by this assay, and inadequate number of viral copies(<138 copies/mL). A negative result must be combined with clinical observations, patient history, and epidemiological information. The expected result is Negative.  Fact Sheet for Patients:  BloggerCourse.com  Fact Sheet for Healthcare Providers:  SeriousBroker.it  This test is no t yet approved or cleared by the [place] FDA and  has been authorized for detection and/or diagnosis of SARS-CoV-2 by FDA under an Emergency Use Authorization (EUA). This EUA will remain  in effect (meaning this test can be used) for the duration of the COVID-19 declaration under Section 564(b)(1) of the Act, 21 U.S.C.section 360bbb-3(b)(1), unless the authorization is terminated  or revoked sooner.       Influenza A by PCR NEGATIVE NEGATIVE   Influenza B by PCR NEGATIVE NEGATIVE    Comment: (NOTE) The Xpert Xpress SARS-CoV-2/FLU/RSV plus assay is intended as an aid in the diagnosis of influenza from Nasopharyngeal swab specimens and should not be used as a sole basis for treatment. Nasal washings and aspirates are unacceptable for Xpert Xpress SARS-CoV-2/FLU/RSV testing.  Fact Sheet for Patients: BloggerCourse.com  Fact Sheet for Healthcare Providers: SeriousBroker.it  This test is not yet approved or cleared by the [place] FDA and has been authorized for detection and/or diagnosis of SARS-CoV-2 by FDA under an Emergency Use Authorization (EUA).  This EUA will remain in effect (meaning this test can be used) for the duration of the COVID-19 declaration under Section 564(b)(1) of the Act, 21 U.S.C. section 360bbb-3(b)(1), unless the authorization is terminated or revoked.  Performed at [facility], [address], [place], [place] [address]   TSH     Status: Abnormal   Collection Time: 03/30/20 10:15 PM  Result Value Ref Range   TSH 4.792 (H) 0.350 - 4.500 uIU/mL    Comment: Performed by a 3rd Generation assay with a functional sensitivity of <=0.01 uIU/mL. Performed at [facility], [address] [place], [place] [address]   RPR     Status: None   Collection Time: 03/30/20 10:15 PM  Result Value Ref Range   RPR Ser Ql NON REACTIVE NON REACTIVE    Comment: Performed at [facility], [address], [place], [place] [address]  Urinalysis, Routine w reflex microscopic Urine, Clean Catch     Status: Abnormal   Collection Time: 03/30/20 10:16 PM  Result Value Ref Range  Color, Urine STRAW (A) YELLOW   APPearance CLEAR CLEAR   Specific Gravity, Urine 1.006 1.005 - 1.030   pH 6.0 5.0 - 8.0   Glucose, UA NEGATIVE NEGATIVE mg/dL   Hgb urine dipstick NEGATIVE NEGATIVE   Bilirubin Urine NEGATIVE NEGATIVE   Ketones, ur NEGATIVE NEGATIVE mg/dL   Protein, ur NEGATIVE NEGATIVE mg/dL   Nitrite NEGATIVE NEGATIVE   Leukocytes,Ua MODERATE (A) NEGATIVE   RBC / HPF 0-5 0 - 5 RBC/hpf   WBC, UA 0-5 0 - 5 WBC/hpf   Bacteria, UA RARE (A) NONE SEEN   Squamous Epithelial / LPF 0-5 0 - 5   Mucus PRESENT     Comment: Performed at [facility], [address], [place], [place] [address]    Medications:  Current Facility-Administered Medications  Medication Dose Route Frequency Provider Last Rate Last Admin  . amLODipine (NORVASC) tablet 10 mg  10 mg Oral Daily Sabas Sous, MD   10 mg at 03/31/20 1010  . diphenhydrAMINE (BENADRYL) capsule 50 mg  50 mg Oral Q6H PRN Aldean Baker, NP       Or  .  diphenhydrAMINE (BENADRYL) injection 50 mg  50 mg Intramuscular Q6H PRN Aldean Baker, NP   50 mg at 03/30/20 2253  . escitalopram (LEXAPRO) tablet 10 mg  10 mg Oral Daily Sabas Sous, MD   10 mg at 03/31/20 1009  . haloperidol (HALDOL) tablet 5 mg  5 mg Oral BID PRN Aldean Baker, NP       Or  . haloperidol lactate (HALDOL) injection 5 mg  5 mg Intramuscular BID PRN Aldean Baker, NP   5 mg at 03/30/20 2253  . hydrochlorothiazide (MICROZIDE) capsule 12.5 mg  12.5 mg Oral Daily Sabas Sous, MD   12.5 mg at 03/31/20 1010  . LORazepam (ATIVAN) tablet 1 mg  1 mg Oral Q6H PRN Aldean Baker, NP       Or  . LORazepam (ATIVAN) injection 1 mg  1 mg Intramuscular Q6H PRN Aldean Baker, NP   1 mg at 03/31/20 0148  . losartan (COZAAR) tablet 50 mg  50 mg Oral Daily Sabas Sous, MD   50 mg at 03/31/20 1009  . QUEtiapine (SEROQUEL) tablet 100 mg  100 mg Oral QHS Aldean Baker, NP   100 mg at 03/30/20 2209   Current Outpatient Medications  Medication Sig Dispense Refill  . ALPRAZolam (XANAX) 1 MG tablet Take one tablet one hour prior to an MRI scan (Patient taking differently: Take 1 mg by mouth daily as needed for anxiety (prior to MRI scan).) 20 tablet 0  . amLODipine (NORVASC) 10 MG tablet Take 1 tablet (10 mg total) by mouth daily. 90 tablet 3  . [START ON 04/22/2020] amphetamine-dextroamphetamine (ADDERALL XR) 10 MG 24 hr capsule Take 1 capsule (10 mg total) by mouth daily. 30 capsule 0  . escitalopram (LEXAPRO) 10 MG tablet Take 1 tablet (10 mg total) by mouth daily. 90 tablet 3  . losartan-hydrochlorothiazide (HYZAAR) 50-12.5 MG tablet Take 1 tablet by mouth daily. 90 tablet 3  . Multiple Vitamins-Minerals ([facility] SPECTRAVITE ADULTS) TABS Take 1 tablet by mouth daily.    . Multiple Vitamin (MULTIVITAMIN) tablet Take 1 tablet by mouth daily. (Patient not taking: No sig reported) 90 tablet 3  . QUEtiapine (SEROQUEL) 100 MG tablet Take 1 tablet (100 mg total) by mouth at bedtime. 30 tablet 0     Psychiatric Specialty Exam: Physical  Exam  Review of Systems  Blood pressure (!) 109/94, pulse 87, temperature 98.3 F (36.8 C), temperature source Oral, resp. rate 16, SpO2 95 %.There is no height or weight on file to calculate BMI.  General Appearance: Fairly Groomed  Eye Contact:  Good  Speech:  Normal Rate  Volume:  Normal  Mood:  Euthymic  Affect:  Appropriate and Congruent  Thought Process:  Coherent and Goal Directed  Orientation:  Other:  Oriented to person, place, situation, and year. Not oriented to month  Thought Content:  Logical  Suicidal Thoughts:  No  Homicidal Thoughts:  No  Memory:  Immediate;   Fair Recent;   Poor Remote;   Fair  Judgement:  Fair  Insight:  Lacking  Psychomotor Activity:  Normal  Concentration:  Concentration: Fair and Attention Span: Fair  Recall:  [employer] of Knowledge:  Fair  Language:  Fair  Akathisia:  No  Handed:  Right  AIMS (if indicated):     Assets:  Communication Skills Desire for Improvement Financial Resources/Insurance Housing Social Support  ADL's:  Intact  Cognition:  Impaired,  Moderate  Sleep:       Disposition: Patient with ongoing memory issues for several months now. She shows no evidence of acute risk of harm to self or others and is psych cleared for discharge. CSW consulted for referrals for neurology. ED staff updated.   This service was provided via telemedicine using a 2-way, interactive audio and video technology with the identified patient and this Clinical research associate.  Aldean Baker, NP 03/31/2020 2:15 PM

## 2020-04-01 ENCOUNTER — Encounter: Payer: Self-pay | Admitting: Psychology

## 2020-04-01 NOTE — Telephone Encounter (Signed)
Pt has appt 12/21

## 2020-04-01 NOTE — Telephone Encounter (Signed)
She is already scheduled to see the Neuropsychologist. We need to start there

## 2020-04-02 ENCOUNTER — Telehealth: Payer: Self-pay | Admitting: Family Medicine

## 2020-04-02 NOTE — Telephone Encounter (Signed)
The [employer] Certification of Health Care Provider form to be filled out--placed in dr's folder.  Call [phone] upon completion.

## 2020-04-07 ENCOUNTER — Encounter: Payer: Self-pay | Admitting: Psychology

## 2020-04-07 ENCOUNTER — Ambulatory Visit (INDEPENDENT_AMBULATORY_CARE_PROVIDER_SITE_OTHER): Payer: BC Managed Care – PPO | Admitting: Psychology

## 2020-04-07 ENCOUNTER — Other Ambulatory Visit: Payer: Self-pay

## 2020-04-07 ENCOUNTER — Ambulatory Visit: Payer: BC Managed Care – PPO | Admitting: Psychology

## 2020-04-07 DIAGNOSIS — F418 Other specified anxiety disorders: Secondary | ICD-10-CM | POA: Diagnosis not present

## 2020-04-07 DIAGNOSIS — R4189 Other symptoms and signs involving cognitive functions and awareness: Secondary | ICD-10-CM | POA: Diagnosis not present

## 2020-04-07 DIAGNOSIS — R41840 Attention and concentration deficit: Secondary | ICD-10-CM

## 2020-04-07 NOTE — Progress Notes (Addendum)
   Neuropsychology Feedback Session Eligha Bridegroom. [facility] [place] Department of Neurology  Additional documentation:   Ms. Douglass' mother requested that I contact her at the conclusion of Ms. Dermody' evaluation so that she could return to pick her up. During this phone call, I explained that we were unable to complete testing as Ms. Tucker was resistant and unwilling to engage in the testing process. Colloquially, I informed her that I am unable to force Ms. Bara into remembering lengthy information or otherwise participate in cognitive testing. This must be done willfully by the patient in order to obtain valid and useful information as cognitive testing is unlike standard medical procedures or imaging. She did not appear accepting of this explanation and requested to be allowed in the testing room to provide encouragement upon her arrival. I stated that we had spent more time attempting to convince Ms Petro to engage in testing relative to time spent actually testing and that the data we already had obtained was invalid and not useful. Continued attempts at testing was not viewed as an appropriate way to spend time given prior behavioral observations. Ms. Mcbrearty' mother was appeared upset/frustrated by this and questioned what she should do next. I suggested that Ms. Band follow-up with her PCP has he is responsible for heading Ms. Pedley' care. This would be the standard procedure even if testing was able to be completed. I also stated that I would inform him of the complications of testing directly.

## 2020-04-07 NOTE — Progress Notes (Addendum)
   Psychometrician Note   Cognitive testing was administered to [name] by Wallace Keller, B.S. (psychometrist) under the supervision of Dr. Newman Nickels, Ph.D., licensed psychologist on 04/07/20. During testing, Ms. Debono was notably irrtable and very resistant to engaging in the testing process. The evaluation had to be significantly abbreviated in response to poor engagement and testing tolerance. Please refer to her neuropsychological report for additional details.   Dr. Newman Nickels, Ph.D. checked in with Ms. Madonia as needed to manage any distress related to testing procedures.    The battery of tests administered was selected by Dr. Newman Nickels, Ph.D. with consideration to Ms. Bangura's current level of functioning, the nature of her symptoms, emotional and behavioral responses during interview, level of literacy, observed level of motivation/effort, and the nature of the referral question. This battery was communicated to the psychometrist. Communication between Dr. Newman Nickels, Ph.D. and the psychometrist was ongoing throughout the evaluation and Dr. Newman Nickels, Ph.D. was immediately accessible at all times. Dr. Newman Nickels, Ph.D. provided supervision to the psychometrist on the date of this service to the extent necessary to assure the quality of all services provided.   A total of 60 minutes of billable time were spent face-to-face with Ms. Shiffman by the psychometrist. This includes both test administration and scoring time. Billing for these services is reflected in the clinical report generated by Dr. Newman Nickels, Ph.D.  This note reflects time spent with the psychometrician and does not include test scores or any clinical interpretations made by Dr. Milbert Coulter. The full report will follow in a separate note.

## 2020-04-07 NOTE — Progress Notes (Signed)
NEUROPSYCHOLOGICAL EVALUATION [place]. [facility] Department of Neurology  Date of Evaluation: April 07, 2020  Reason for Referral:   BERNICE MCAULIFFE is a 54 y.o. right-handed Caucasian female referred by Gershon Crane, M.D., to characterize her current cognitive functioning and assist with diagnostic clarity and treatment planning in the context of a history of ADHD and family concerns surrounding cognitive decline.   Assessment and Plan:   Clinical Impression(s): During testing, Ms. Brabender exhibited noted irritability and was very resistant to completing all tasks she was asked to complete. This was despite repeated encouragement from the psychometrist, as well as requiring brief interventions with myself on several occassions. She repeatedly questioned the purpose the evaluation, stated that this was a "waste of [her] time," and was angered by the overall length of the evaluation. The evaluation was extremely abbreviated in response. Tests that were completed were described as "something a kindergartner" would do and she noted that they made her feel "self-conscious and retarded." Please see the behavioral observations section for additional details. Performance across stand-alone and embedded validity indicators were consistently below expectation. Given this, in addition to behavioral observations described above, obtained data is invalid and should not be interpreted.  Memory difficulties are quite common in individuals with ADHD as trouble with sustained attention impacts encoding (i.e., learning) and retrieval processes. Ms. Briones denied all cognitive concerns outside of attention/concentration. Memory deficits such as what her mother described (e.g., leaving the front door open) are within the realm of expectation given Ms. Melvyn Neth' ADHD history. These symptoms, coupled with ongoing mood-concerns and irritability represent the most likely culprit for subjective cognitive  decline. Given her young age and largely normal neuroimaging (outside of small vessel ischemic changes), the presence of a neurodegenerative condition would be extremely unlikely based solely on population base rates. Cognitive testing would be beneficial in the future should Ms. Chartrand become more amenable to not only the testing process but also its required length.  Note: Given that there are no results to share due to lack of adequate engagement, no feedback appointment will be scheduled. She is encouraged to follow-up with her PCP for continued care.    Recommendations: Appropriate medication adherence, especially her Adderall, will be important in managing current symptoms.   Ms. Herrig reported consuming 4-5 "small" bottles of beer per day during the current interview. The CDC describes heavy drinking for women as consuming more than 8 alcoholic beverages per week. As Ms. Reise is far beyond this threshold based upon her report, I would strongly encourage her to significantly diminish her alcohol intake. Chronic heavy alcohol use can certainly impact cognitive functioning. It is important to highlight that while alcohol may help Ms. Scala fall asleep (as was reported by her mother), it actually yields far poorer sleep quality and could prevent her from obtaining the restorative properties sleep offers.   Irritability can certainly be an associated symptom of both depression and anxiety. A combination of medication and psychotherapy has been shown to be most effective at treating symptoms of psychiatric distress. As such, Ms. Cahn is encouraged to speak with her PCP regarding medication adjustments to optimally manage these symptoms. However, given that her mother reported that Ms. Malena was prescribed mood-related medications but has refused to take it, it might be best to start by addressing this resistance.  Likewise, Ms. Calder is encouraged to consider engaging in short-term psychotherapy to  address symptoms of psychiatric distress. She would benefit from an active and collaborative  therapeutic environment, rather than one purely supportive in nature. Recommended treatment modalities include Cognitive Behavioral Therapy (CBT) or Acceptance and Commitment Therapy (ACT).  Ms. Naff is encouraged to attend to lifestyle factors for brain health (e.g., regular physical exercise, good nutrition habits, regular participation in cognitively-stimulating activities, and general stress management techniques), which are likely to have benefits for both emotional adjustment and cognition. In fact, in addition to promoting good general health, regular exercise incorporating aerobic activities (e.g., brisk walking, jogging, cycling, etc.) has been demonstrated to be a very effective treatment for depression and stress, with similar efficacy rates to both antidepressant medication and psychotherapy. Optimal control of vascular risk factors (including safe cardiovascular exercise and adherence to dietary recommendations) is encouraged.   When learning new information, she would benefit from information being broken up into small, manageable pieces. She may also find it helpful to articulate the material in her own words and in a context to promote encoding at the onset of a new task. This material may need to be repeated multiple times to promote encoding.  Memory can be improved using internal strategies such as rehearsal, repetition, chunking, mnemonics, association, and imagery. External strategies such as written notes in a consistently used memory journal, visual and nonverbal auditory cues such as a calendar on the refrigerator or appointments with alarm, such as on a cell phone, can also help maximize recall.    To address problems with processing speed, she may wish to consider:   -Ensuring that she is alerted when essential material or instructions are being presented   -Adjusting the speed at which  new information is presented   -Allowing for more time in comprehending, processing, and responding in conversation  To address problems with fluctuating attention, she may wish to consider:   -Avoiding external distractions when needing to concentrate   -Limiting exposure to fast paced environments with multiple sensory demands   -Writing down complicated information and using checklists   -Attempting and completing one task at a time (i.e., no multi-tasking)   -Verbalizing aloud each step of a task to maintain focus   -Reducing the amount of information considered at one time  Review of Records:   Ms. Stuckert was seen by [facility] Gershon Crane, M.D.) on 01/20/2020 for follow-up. Ms. Casali was noted to have moved in with her mother this past February. Since this time, her mother noted ongoing cognitive concerns, particularly surrounding short-term memory. Ms. Palka attributed cognitive difficulties to her history of ADHD. Dr. Clent Ridges observed that a 30-day supply of Adderall was lasting her 2-3 months, suggesting poor medication adherence. Ms. Barclift admitted at that time to only taking this medication several days per week rather than daily. Ms. Mcreynolds acknowledged symptoms of depression earlier this summer but these were said to have resolved. Lately, she described her mood as good; her mother was noted to have agreed with this. She reported sleeping well and having a good appetite. Dr. Clent Ridges expressed concerns that memory problems were due to symptoms of ADHD and poor medication adherence. It was stressed that this medication (Adderall) needs to be taken daily as prescribed in order to be effective. She was further seen by Dr. Clent Ridges on 02/21/2020 for her annual exam. Memory concerns were again reiterated. Ultimately, Ms. Neuhaus was referred for a comprehensive neuropsychological evaluation to characterize her cognitive abilities and to assist with diagnostic clarity and treatment planning.   On  03/30/2020, Ms. Dembeck was seen at the [facility] ED due to  reported suicidal ideation. She was brought to the ED by her mother. Briefly, over that weekend Ms. Fornes stated that she was going to kill herself. While in the ED, she reported saying this out of anger and never had any intention of harming herself in any way. She denied any recent life stressors, significant anxiety or depression, or issues with drugs or alcohol. She reiterated that this was due to her being hungry and said this out of anger. She was eventually cleared by psychiatry and discharged home.  Brain MRI on 03/26/2020 revealed mild chronic microvascular ischemic changes, as well as a few chronic small vessel infarcts or prominent perivascular spaces along the deep right cerebral white matter. Head CT on 03/30/2020 reiterated findings of her MRI, noting lacunar infarcts/prominent perivascular spaces in the right corona radiata.   Past Medical History:  Diagnosis Date  . ADHD (attention deficit hyperactivity disorder), inattentive type 09/17/2018  . Depression with anxiety 05/05/2006  . HTN (hypertension) 10/09/2012  . Sinusitis   . Tobacco use     No past surgical history on file.   Current Outpatient Medications:  .  ALPRAZolam (XANAX) 1 MG tablet, Take one tablet one hour prior to an MRI scan (Patient taking differently: Take 1 mg by mouth daily as needed for anxiety (prior to MRI scan).), Disp: 20 tablet, Rfl: 0 .  amLODipine (NORVASC) 10 MG tablet, Take 1 tablet (10 mg total) by mouth daily., Disp: 90 tablet, Rfl: 3 .  [START ON 04/22/2020] amphetamine-dextroamphetamine (ADDERALL XR) 10 MG 24 hr capsule, Take 1 capsule (10 mg total) by mouth daily., Disp: 30 capsule, Rfl: 0 .  escitalopram (LEXAPRO) 10 MG tablet, Take 1 tablet (10 mg total) by mouth daily., Disp: 90 tablet, Rfl: 3 .  losartan-hydrochlorothiazide (HYZAAR) 50-12.5 MG tablet, Take 1 tablet by mouth daily., Disp: 90 tablet, Rfl: 3 .  Multiple Vitamin (MULTIVITAMIN)  tablet, Take 1 tablet by mouth daily. (Patient not taking: No sig reported), Disp: 90 tablet, Rfl: 3 .  Multiple Vitamins-Minerals ([employer] SPECTRAVITE ADULTS) TABS, Take 1 tablet by mouth daily., Disp: , Rfl:  .  QUEtiapine (SEROQUEL) 100 MG tablet, Take 1 tablet (100 mg total) by mouth at bedtime., Disp: 30 tablet, Rfl: 0  Clinical Interview:   The following information was obtained during a clinical interview with Ms. Gingras and her mother prior to cognitive testing.  Cognitive Symptoms: Decreased short-term memory: Denied. Her mother emphasized ongoing memory concerns. When asked, she provided details surrounding Ms. Jagger forgetting to close the front door to her house or close the door to her car. Her mother denied witnessed deficits surrounding Ms. Botkins recalling the details of previous conversations or the names of familiar individuals.  Decreased long-term memory: Denied. Decreased attention/concentration: Endorsed. Ms. Hedger reported a history of ADHD, diagnosed as an adult. However, symptoms were said to go back to early childhood/adolescence. She reported currently taking her Adderall as prescribed (i.e., daily), stating that she felt it was beneficial in her ability to sustain her attention.  Reduced processing speed: Denied. Difficulties with executive functions: Denied. Her mother reported personality changes in the form of significant irritability which have been present and worsening over the past four months.  Difficulties with emotion regulation: Denied. Difficulties with receptive language: Denied. Difficulties with word finding: Denied. Decreased visuoperceptual ability: Denied.  Difficulties completing ADLs: Denied. She does not currently drive and I was unable to obtain a clear answer as to why. When asked, Ms. Courser focused on prior employment pursuits which  would require her to drive around the state. However, she never directly answered the question why she no longer drives.  Her mother stated that this is due to her being anxious about driving; however, Ms. Bing denied that. Ultimately, the reason for her no longer driving remains unclear. She denied any accidents or close calls prior to her stopping.   Additional Medical History: History of traumatic brain injury/concussion: Denied. History of stroke: Denied. History of seizure activity: Denied. History of known exposure to toxins: Denied. Symptoms of chronic pain: She did acknowledge lower back pain and carpal tunnel syndrome. Chronic pain symptoms were denied.  Experience of frequent headaches/migraines: Denied. Frequent instances of dizziness/vertigo: Denied.  Sensory changes: She wears glasses with positive effect. Other sensory changes/difficulties (i.e., hearing, taste, or smell) were denied.  Balance/coordination difficulties: Denied. She also denied a history of falls. Other motor difficulties: Denied.  Sleep History: Estimated hours obtained each night: 8 hours.  Difficulties falling asleep: Denied. Difficulties staying asleep: Denied. Feels rested and refreshed upon awakening: Endorsed.  History of snoring: Denied. History of waking up gasping for air: Denied. Witnessed breath cessation while asleep: Denied.  History of vivid dreaming: Denied. Excessive movement while asleep: Denied. Instances of acting out her dreams: Denied.  Psychiatric/Behavioral Health History: Depression: Ms. Titsworth denied a history of depression. However, her mother stated that she was diagnosed with depression one week earlier when she was seen in the ED for reported suicidal ideation. Ms. Butler was consistent with what is reported in her medical records, stating that she made comments about wanting to harm herself due to acute anger and never had any intention to act upon these thoughts. Her mother noted that she was prescribed a mood-related medication but had so far refused to take it. Prior utilization of a  therapist/counselor was denied. Current suicidal ideation, intent, or plan was also denied.  Anxiety: Ms. Magel denied current anxiety symptoms but did acknowledge a potential history of anxiety dating back to her high school and college years. Despite this, her mother reported that she was experiencing significant anxiety and had recently been given an alprazolam/Xanax prescription. The bottle with these pills was shown to be during interview.  Mania: Denied. Trauma History: Denied. Visual/auditory hallucinations: Denied. Delusional thoughts: Denied.  Tobacco: Endorsed. She reported currently smoking 1/2 pack of cigarettes per day.  Alcohol: She reported consuming 4-5 "small" beers per day on average. She denied a history of problematic alcohol abuse or dependence.  Recreational drugs: Denied. Caffeine: She reported consuming 3-4 "big bottles" of soda per day.   Family History: Problem Relation Age of Onset  . Liver disease Other   . Alzheimer's disease Maternal Grandmother    This information was confirmed by Ms. Chura.  Academic/Vocational History: Highest level of educational attainment: 14 years. She reported graduating from high school and completed an Associate's degree. Her mother described her as a poor (C/D) student in academic settings. This was largely attributed to undetected ADHD symptoms by Ms. Pung. Many poor performances were related to lack of interest or the inability to pay attention. Laurena Bering was noted as a relative weakness.  History of developmental delay: Denied. History of grade repetition: Denied. Enrollment in special education courses: Denied. History of LD/ADHD: Endorsed. See above regarding her history of ADHD.   Employment: Unclear. 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 Her mother noted that she was deemed "disabled" and unable to work due to  this. It was unclear if her mother was  referring to short-term disability specifically provided by her employer. Ms. Spain would certainly be unable to be deemed disabled from a federal perspective in such a short amount of time with questionable psychiatric diagnoses and self-report that her suicidal ideation appeared more manipulative (i.e., out of anger) rather than related to traditional mood symptoms.   Evaluation Results:   Behavioral Observations: Ms. Stickley was accompanied by her mother, arrived to her appointment on time, and was appropriately dressed and groomed. She appeared alert and oriented. Observed gait and station were within normal limits. Gross motor functioning appeared intact upon informal observation and no abnormal movements (e.g., tremors) were noted. During interview, Ms. Eriksson was noted to be extremely irritable and quite abrasive to myself. She emphasized that cognitive testing was not necessary and at several points stated that she would refuse to engage in testing due to its overall length. During interview, she questioned the purpose of several questions asked of her. She was also defensive and would attempt to turn questions back on myself in a somewhat belittling way. This was despite her mother encouraging her to answer questions honestly and fully engage in the process. Spontaneous speech was fluent and word finding difficulties were not observed during the clinical interview. Thought processes were coherent and normal in content. She was noted to repeat herself quite frequently, especially highlighting that she has a history of ADHD. Insight into her cognitive difficulties was unable to be assessed given her lack of engagement during testing (see below).   During testing, Ms. Cantara was noted to enter the testing room by throwing her purse on the floor and emphasizing that she was "pissed." She again expressed anger and disbelief at the length of the testing session, stating that she was hungry and not eaten prior  to her appointment. A ginger ale was provided for her. She was very distractible during testing. For example, she would be in the midst of a cognitive test, only to then ask the psychometrist about the color of her shirt. She repeatedly stated that cognitive tests were "judging her" which increased frustration and poor testing tolerance. She also repeatedly stated that she was not listening to the psychometrist and that because of her ADHD symptoms, information "goes in one ear and out the other." Several attempts were made by myself to convince Ms. Biever to reschedule testing at a time where she would be more willing to appropriately engage in the process. She would consistently state that she wanted to "get it done," would apologize, emphasize that she was not being difficult, and promise that she would try harder and persist. Unfortunately, these promises were not kept as this process cycled several times. When the topic of discontinuing and rescheduling was brought up, she at one point refused to leave the psychometrists office until we could "get it over with." When it was explained to her by both myself and the psychometrician that just getting through testing is not appropriate and would not allow for interpretable data, she continued to reiterate pressing forward. Testing was significantly shortened to where only the RBANS and a few other tasks were administered. Effort was noted to be extremely poor across these assessments with Ms. Brott refusing to complete several tasks.   Also of note, while I had left my office to get the psychometrician so she and Ms. Bolyard could begin testing, Ms. Appleyard was left alone with the door shut. A clinic-employed passerby outside of my  office noted hearing slamming noises from my office of unclear origin, potentially reflecting drawers being opened and closed quite loudly. When I returned, some books had been moved, but no noteworthy actions were detectable.   Adequacy  of Effort: The validity of neuropsychological testing is limited by the extent to which the individual being tested may be assumed to have exerted adequate effort during testing. Ms. Tootle was resistant to all testing procedures presented to her and poorly engaged throughout. Scores across stand-alone and embedded performance validity measures were consistently below expectation. Due to poor engagement, testing was significantly abbreviated. Obtained results are invalid and current test results are not interpretable.   Test Results: Ms. Heyne was poorly oriented to time during the current evaluation. She was unable to provide the current month, date, or time.   Intellectual abilities based upon educational and vocational attainment were estimated to be in the below average to average range. Premorbid abilities were estimated to be within the below average range based upon a single-word reading test.   Processing speed was unable to be fully assessed. Performance on a visuomotor numerical sequencing task (TMT A) was average. However, she refused to attempt a rapid decoding task (RBANS Coding). Basic attention was below average. More complex attention (e.g., working memory) was unable to be assessed. Executive functioning was unable to be assessed. Performance on a visuomotor cognitive flexibility task (TMT B) was discontinued due to her refusal to persist and complete this task. No other executive functioning tasks were attempted.   Receptive language abilities were unable to be assessed. Semantic fluency and confrontation naming were exceptionally low. Phonemic fluency was unable to be assessed.   Assessed visuospatial/visuoconstructional abilities were impaired. Her drawing of a clock was impaired. Points were lost for numerical spatial abnormalities, the number 10 being placed twice, and no attempt at drawing clock hands. Points were lost on her drawing of a complex figure due to poor effort, a very  rapid and sloppy approach, and the omission of several aspects altogether. She refused to attempt a line orientation task.    Learning (i.e., encoding) of novel verbal information was exceptionally low and notable for poor engagement (i.e., stating "I'm not paying attention" while information was being read to her). Spontaneous delayed recall (i.e., retrieval) of previously learned information was also exceptionally low with minimal provided effort to remember items. Retention rates were 0% across a story learning task, 0% across a list learning task, and 0% across a complex figure drawing task. Performance across recognition tasks was exceptionally low, suggesting limited evidence for information consolidation, believed to be due to poor effort across testing.   Mood-related questionnaires were unable to be completed.  Informed Consent and Coding/Compliance:   Ms. Lampert was provided with a verbal description of the nature and purpose of the present neuropsychological evaluation. Also reviewed were the foreseeable risks and/or discomforts and benefits of the procedure, limits of confidentiality, and mandatory reporting requirements of this provider. The patient was given the opportunity to ask questions and receive answers about the evaluation. Oral consent to participate was provided by the patient.   This evaluation was conducted by Newman Nickels, Ph.D., licensed clinical neuropsychologist. Ms. Selmer completed a 40 minute comprehensive clinical interview with Dr. Milbert Coulter, billed as one unit [phone], and 60 minutes of cognitive testing and scoring, billed as one unit [phone] and one additional unit [phone]. Psychometrist Wallace Keller, B.S., assisted Dr. Milbert Coulter with test administration and scoring procedures. As a separate and discrete service, Dr.  Jestina Stephani spent a total of 100 minutes in interpretation and report writing billed as one unit [id number] and one unit [phone].

## 2020-04-08 ENCOUNTER — Telehealth: Payer: Self-pay | Admitting: Family Medicine

## 2020-04-08 ENCOUNTER — Telehealth: Payer: Self-pay

## 2020-04-08 DIAGNOSIS — Z0279 Encounter for issue of other medical certificate: Secondary | ICD-10-CM

## 2020-04-08 NOTE — Telephone Encounter (Signed)
Pts mother is calling in stating that the pt did not finish neurologic test b/c she is afraid they are going to tell her that she has dementia and will no longer drive.  Mother would also like to see if she can pick up the disability paperwork that she dropped off on last week when Dr. Clent Ridges was not in the office and she is aware that we ask for 3-5 business days when the provider is in the office and stated that she needs it back to fax to the company on Monday 04/13/2020 she is aware that the provider will prepare paperwork as they have come in to the office.

## 2020-04-08 NOTE — Telephone Encounter (Signed)
Called and spoke to [employer]. Lori Barrett states that Lori Barrett has slowly declined and has her forgetting many things. Pt has had suicidal speech and was taken to the hospital on 03/30/2020. While admitted, Lori Barrett was unable to provide current date, time, or location and was escorted to room by both cops and nurses. Lori Barrett has refused to drive and has been unable to regulate her mood. Lori Barrett states that she has changed from her normally happy personality. Pt refused Testing with the neurologist due to the fear of the results indicating Dementia. Lori Barrett states that the neurologist did not believe it to be dementia, but ADHD. Lori Barrett states that the paperwork is for Short Term Disability as Lori Barrett cannot perform her daily job duties with her memory failing.

## 2020-04-08 NOTE — Telephone Encounter (Signed)
error 

## 2020-04-09 NOTE — Telephone Encounter (Signed)
Spoke with the pts mother and informed her the forms were completed and left at the front desk for pick up.  Copy given to Margarett to be sent for scanning.

## 2020-04-09 NOTE — Telephone Encounter (Signed)
The disability papers are ready to be picked up

## 2020-04-13 NOTE — Telephone Encounter (Signed)
Patients mother is calling and wanted to thank provider for filling out paperwork in a timely matter for patient. CB is [phone]

## 2020-04-13 NOTE — Telephone Encounter (Signed)
fyi

## 2020-04-14 ENCOUNTER — Encounter: Payer: BC Managed Care – PPO | Admitting: Psychology

## 2020-04-20 ENCOUNTER — Telehealth: Payer: Self-pay | Admitting: Psychology

## 2020-04-20 ENCOUNTER — Telehealth: Payer: Self-pay | Admitting: Family Medicine

## 2020-04-20 NOTE — Telephone Encounter (Signed)
Patient mother would like to speak to someone about what is in her daughter report. She states that in the report it states patient has not been taking her ADHD medication. She states that the patient has been taking the medication and wants to speak to someone about this. She states that she is very upset about this  Please call

## 2020-04-20 NOTE — Telephone Encounter (Signed)
Patient called and wanted to let provider know that patient has been taking her ADHD and depression medication. Also patient has a psychiatric evaluation on 05/07/20, please advise. CB is [phone]

## 2020-04-20 NOTE — Telephone Encounter (Signed)
I returned her mother's phone call this morning at 10:25am. She reported being upset that the report suggested that Lori Barrett was not taking ADHD medication as prescribed when this is not the case.   The review of records section of her report stated "Dr. Clent Ridges observed that a 30-day supply of Adderall was lasting her 2-3 months, suggesting poor medication adherence. Lori Barrett admitted at that time to only taking this medication several days per week rather than daily." This information was pulled directly from Dr. Claris Che note dated 01/20/2020.   I highlighted that in the section of the report detailing current information, I stated "Lori Barrett reported a history of ADHD, diagnosed as an adult. However, symptoms were said to go back to early childhood/adolescence. She reported currently taking her Adderall as prescribed (i.e., daily), stating that she felt it was beneficial in her ability to sustain her attention."  I attempted to explain to her mother that the review of records section of the report represents past medical records which were reviewed and that while this may be inaccurate at present, it was accurate when Dr. Clent Ridges made this notation back on October. Her mother was angered by this, stated "I guess I can't get anywhere with you," and abruptly hung up, ending our phone call. Should she have additional questions, she is welcome to call back and I will answer her questions to the best of my ability.

## 2020-04-23 NOTE — Telephone Encounter (Signed)
Noted  

## 2020-04-27 ENCOUNTER — Ambulatory Visit: Payer: 59 | Admitting: Family Medicine

## 2020-04-27 ENCOUNTER — Other Ambulatory Visit: Payer: Self-pay

## 2020-04-27 ENCOUNTER — Encounter: Payer: Self-pay | Admitting: Family Medicine

## 2020-04-27 VITALS — BP 110/70 | HR 90 | Temp 98.8°F | Ht 62.0 in | Wt 119.0 lb

## 2020-04-27 DIAGNOSIS — F418 Other specified anxiety disorders: Secondary | ICD-10-CM | POA: Diagnosis not present

## 2020-04-27 DIAGNOSIS — R410 Disorientation, unspecified: Secondary | ICD-10-CM | POA: Diagnosis not present

## 2020-04-27 DIAGNOSIS — F900 Attention-deficit hyperactivity disorder, predominantly inattentive type: Secondary | ICD-10-CM

## 2020-04-27 MED ORDER — QUETIAPINE FUMARATE 100 MG PO TABS: 100.0000 mg | ORAL_TABLET | Freq: Every day | ORAL | 0 refills | Status: DC

## 2020-04-27 NOTE — Progress Notes (Signed)
   Subjective:    Patient ID: Lori Barrett, female    DOB: 07/04/1965, 55 y.o.   MRN: [id number]  HPI Here with her mother to follow up. We had referred her to Dr. Vanessa Barrett for neuropsychological testing, but she walked out before this could be completed. She now has an appt to see someone at [facility] on 05-07-20. According to Lori Barrett, she is doing "fine", but according to her mother she is struggling. Her appetite is good and she sleeps well.    Review of Systems  Constitutional: Negative.   Respiratory: Negative.   Cardiovascular: Negative.   Psychiatric/Behavioral: Positive for confusion. Negative for agitation and dysphoric mood. The patient is nervous/anxious.        Objective:   Physical Exam Constitutional:      Appearance: Normal appearance.  Cardiovascular:     Rate and Rhythm: Normal rate and regular rhythm.     Pulses: Normal pulses.     Heart sounds: Normal heart sounds.  Pulmonary:     Effort: Pulmonary effort is normal.     Breath sounds: Normal breath sounds.  Neurological:     Mental Status: She is alert.  Psychiatric:        Mood and Affect: Mood normal.        Behavior: Behavior normal.        Thought Content: Thought content normal.           Assessment & Plan:  ADHD and depression with anxiety, apparently stable. She will see Psychiatry as above.  Lori Crane, MD

## 2020-05-06 ENCOUNTER — Telehealth: Payer: Self-pay | Admitting: Family Medicine

## 2020-05-06 DIAGNOSIS — Z0279 Encounter for issue of other medical certificate: Secondary | ICD-10-CM

## 2020-05-06 NOTE — Telephone Encounter (Signed)
The [employer] Certification of Health Care Provider to be filled out- placed in dr's folder.  Call [phone] upon completion.

## 2020-05-08 NOTE — Telephone Encounter (Signed)
Spoke with Ms Lori Barrett and informed her the form was left at the front desk for pick up.  Copy sent to be scanned.

## 2020-05-08 NOTE — Telephone Encounter (Signed)
The form is ready  

## 2020-05-20 ENCOUNTER — Other Ambulatory Visit: Payer: Self-pay | Admitting: Family Medicine

## 2020-05-25 NOTE — Telephone Encounter (Signed)
Patient needs this Rx sent to  [id number] ,  - [address] AT [facility] [address] [facility] [address] Phone:  [phone]  Fax:  [phone]

## 2020-05-25 NOTE — Telephone Encounter (Signed)
Patient has called this morning in regards to refill on the Quetiapine.   Please advise.   Thanks.  Dm/cma

## 2020-05-25 NOTE — Telephone Encounter (Signed)
Last office visit/ last refill-  04/27/2020--30 tabs no refills

## 2020-05-25 NOTE — Telephone Encounter (Signed)
lft VM that rx was sent to the pharmacy and to call back if any problems.  Dm/cma

## 2020-05-28 ENCOUNTER — Telehealth: Payer: Self-pay | Admitting: Family Medicine

## 2020-05-28 NOTE — Telephone Encounter (Signed)
[employer] Statement of Disability form to be filled out--placed in dr's folder.  Call [phone] upon completion.

## 2020-06-02 NOTE — Telephone Encounter (Signed)
Called and spoke to [name] [phone] and advised that forms have been faxed to [employer] [phone].  Copy placed up front for pickup. Dm/cma

## 2020-06-03 ENCOUNTER — Telehealth: Payer: Self-pay | Admitting: Family Medicine

## 2020-06-03 NOTE — Telephone Encounter (Signed)
Pt mom [name] calling want to discuss patient ADHD and some changes that is going on with the patient. Please call [phone]. The patient does not know the mom is calling to talk with Dr. Clent Ridges.

## 2020-06-03 NOTE — Telephone Encounter (Signed)
She was going to see a psychiatrist (which I insisted on). Have they made an appt yet?

## 2020-06-03 NOTE — Telephone Encounter (Signed)
Spoke with patients mom [name], she stated  that over the past few weeks the patient has gotten worse.  She has became very hostile, and can't remember anything.  Mom said that she doesn't know what to do.  Please advise

## 2020-06-04 NOTE — Telephone Encounter (Signed)
Mom Lori Barrett said that the patient has been a to psychiatrist but she said it wasn't a "real doctor", because he only had a masters degree and didn't "do anything for her".

## 2020-06-05 NOTE — Telephone Encounter (Signed)
Left message on machine for [name] to return our call.

## 2020-06-05 NOTE — Telephone Encounter (Signed)
Tell her mother that Jazz really needs to see an MD psychiatrist because I think she has a serious illness and she needs to see a specialist asap

## 2020-06-05 NOTE — Telephone Encounter (Signed)
[name] returned Rachel's call.

## 2020-06-08 NOTE — Telephone Encounter (Signed)
Patient's mother notified VIA to call and scheduled an appt with a  psychiatrist MD.  She will call [facility] again to try. Dm/cma

## 2020-06-12 ENCOUNTER — Telehealth: Payer: Self-pay | Admitting: Family Medicine

## 2020-06-12 NOTE — Telephone Encounter (Signed)
Noted  

## 2020-06-12 NOTE — Telephone Encounter (Signed)
FYI

## 2020-06-12 NOTE — Telephone Encounter (Signed)
Pts mother is calling in to let us know that the pt has a new psychiatrist and the 1st appointment with them is 07/22/2020

## 2020-06-17 ENCOUNTER — Telehealth: Payer: Self-pay | Admitting: Family Medicine

## 2020-06-17 MED ORDER — AMPHETAMINE-DEXTROAMPHET ER 10 MG PO CP24: 10.0000 mg | ORAL_CAPSULE | Freq: Every day | ORAL | 0 refills | Status: DC

## 2020-06-17 NOTE — Telephone Encounter (Signed)
Left a detailed message for pt regarding her medication refill

## 2020-06-17 NOTE — Telephone Encounter (Signed)
Patient mother is calling and wanted to see if provider received paper from the [employer] for patient, please advise. CB is [phone]

## 2020-06-17 NOTE — Addendum Note (Signed)
Addended by: Gershon Crane A on: 06/17/2020 01:08 PM   Modules accepted: Orders

## 2020-06-17 NOTE — Telephone Encounter (Signed)
Pt LOV was on 04/27/2020 and last refill was done 04/22/2020 for 30 tablets, please advise

## 2020-06-17 NOTE — Telephone Encounter (Signed)
Done

## 2020-06-17 NOTE — Telephone Encounter (Signed)
Pt is calling in needing a refill on Rx amphetamine-dextroamphetamine (ADDERALL XR) 10 MG  [facility] [address]

## 2020-06-18 NOTE — Telephone Encounter (Signed)
Left a message on pt voicemail to call the office back regarding this message

## 2020-06-24 ENCOUNTER — Telehealth: Payer: Self-pay | Admitting: Family Medicine

## 2020-06-24 ENCOUNTER — Other Ambulatory Visit: Payer: Self-pay

## 2020-06-24 ENCOUNTER — Encounter: Payer: Self-pay | Admitting: Family Medicine

## 2020-06-24 ENCOUNTER — Ambulatory Visit: Payer: 59 | Admitting: Family Medicine

## 2020-06-24 VITALS — BP 108/74 | HR 82 | Temp 98.1°F | Wt 121.8 lb

## 2020-06-24 DIAGNOSIS — F900 Attention-deficit hyperactivity disorder, predominantly inattentive type: Secondary | ICD-10-CM

## 2020-06-24 DIAGNOSIS — F418 Other specified anxiety disorders: Secondary | ICD-10-CM

## 2020-06-24 DIAGNOSIS — I10 Essential (primary) hypertension: Secondary | ICD-10-CM | POA: Diagnosis not present

## 2020-06-24 MED ORDER — AMPHETAMINE-DEXTROAMPHET ER 20 MG PO CP24: 20.0000 mg | ORAL_CAPSULE | ORAL | 0 refills | Status: DC

## 2020-06-24 NOTE — Telephone Encounter (Signed)
Please advise 

## 2020-06-24 NOTE — Telephone Encounter (Signed)
I sent in for Adderall XR 20 mg to take daily

## 2020-06-24 NOTE — Progress Notes (Signed)
   Subjective:    Patient ID: Lori Barrett, female    DOB: 01-28-1966, 55 y.o.   MRN: [id number]  HPI Here with her mother to follow up on HTN and ADHD. She has been feeling well, but her other is still very worried about her. She is still not working or driving, and she spends all her time atr home watching TV. Her appetite is good and she sleeps well. She has an appt to see a psychiatrist at [facility] (possibly Dr. Madaline Guthrie) on April 6. Her mother also wants [place] to check her BP.    Review of Systems  Constitutional: Negative.   Respiratory: Negative.   Cardiovascular: Negative.   Psychiatric/Behavioral: Positive for agitation, confusion and decreased concentration. Negative for dysphoric mood, hallucinations, self-injury and suicidal ideas. The patient is nervous/anxious.        Objective:   Physical Exam Constitutional:      Appearance: Normal appearance.  Cardiovascular:     Rate and Rhythm: Normal rate and regular rhythm.     Pulses: Normal pulses.     Heart sounds: Normal heart sounds.  Pulmonary:     Effort: Pulmonary effort is normal.     Breath sounds: Normal breath sounds.  Neurological:     Mental Status: She is alert.  Psychiatric:        Thought Content: Thought content normal.     Comments: She is anxious and she argued with her mother about seeing the psychiatrist            Assessment & Plan:  Her HTN is well controlled. For the ADHD, her mother wanted [place] to increase the dose of Adderall, but Genny refused. We will leave it at 10 mg daily for now. As for her depression and anxiety, I strongly encouraged her to see the psychiatrist as scheduled. Gershon Crane, MD

## 2020-06-24 NOTE — Telephone Encounter (Signed)
Patient is calling back and wanted to let the provider know that she does want to double the ADH medication, please advise.CB is [phone]

## 2020-06-26 NOTE — Telephone Encounter (Signed)
Pt.notified

## 2020-06-29 ENCOUNTER — Telehealth: Payer: Self-pay | Admitting: Family Medicine

## 2020-06-29 NOTE — Telephone Encounter (Signed)
Patient dropped off paperwork to be filled out.  She would like a call when completed.   Paperwork placed in folder.

## 2020-06-29 NOTE — Telephone Encounter (Signed)
Pt paperwork has been received and placed on Dr Clent Ridges folder for completing

## 2020-07-02 NOTE — Telephone Encounter (Signed)
Pt paperwork was faxed  to [employer] disability department, left a voice message for pt

## 2020-07-13 ENCOUNTER — Other Ambulatory Visit: Payer: Self-pay | Admitting: Family Medicine

## 2020-07-13 NOTE — Telephone Encounter (Signed)
Ok for 90 days supply per pt pharmacy request

## 2020-08-10 ENCOUNTER — Telehealth: Payer: Self-pay | Admitting: Family Medicine

## 2020-08-10 NOTE — Telephone Encounter (Signed)
Patient is calling and stated that [employer] has requested notes from 4/7 and 4/12 to be faxed to [phone] to approve short term disability, please advise. CB is [phone]

## 2020-08-12 NOTE — Telephone Encounter (Signed)
[employer] - Short Term Disability  Faxed form to [phone]  Disposition: Dr's Folder

## 2020-08-12 NOTE — Telephone Encounter (Signed)
We did not see her on those dates. She may have seen a psychiatrist, but we do not have notes about it

## 2020-08-13 NOTE — Telephone Encounter (Signed)
Spoke with pt mother aware that the office received pt short term disability and they have been placed in Dr Clent Ridges folder for competing, Last office visit note was printed for faxing when the form is complete.

## 2020-08-13 NOTE — Telephone Encounter (Addendum)
Mother called back and wanted to see if provider can fax last office visit notes from 06/24/2020 to [phone], please advise. CB is [phone]

## 2020-08-18 NOTE — Telephone Encounter (Signed)
Pt Short Term Disability forms have been completed and faxed to the number provided, spoke with pt aware that a copy of the form will be sent to scanning, pt verbalized understanding

## 2020-09-01 ENCOUNTER — Other Ambulatory Visit: Payer: Self-pay | Admitting: Family Medicine

## 2020-09-01 NOTE — Telephone Encounter (Signed)
Pt call and stated she need a refill on amphetamine-dextroamphetamine (ADDERALL XR) 20 MG 24 hr capsule [id number] [place] [place] - [address] AT [facility] OF [facility] [address] Phone:  [phone]  Fax:  [phone]     pt want a call back at [phone].

## 2020-09-02 MED ORDER — AMPHETAMINE-DEXTROAMPHET ER 20 MG PO CP24: 20.0000 mg | ORAL_CAPSULE | ORAL | 0 refills | Status: DC

## 2020-09-16 ENCOUNTER — Encounter: Payer: Self-pay | Admitting: Family Medicine

## 2020-09-16 ENCOUNTER — Other Ambulatory Visit: Payer: Self-pay

## 2020-09-16 ENCOUNTER — Ambulatory Visit: Payer: 59 | Admitting: Family Medicine

## 2020-09-16 VITALS — BP 128/70 | HR 83 | Temp 98.0°F | Wt 137.6 lb

## 2020-09-16 DIAGNOSIS — I10 Essential (primary) hypertension: Secondary | ICD-10-CM | POA: Diagnosis not present

## 2020-09-16 DIAGNOSIS — F418 Other specified anxiety disorders: Secondary | ICD-10-CM

## 2020-09-16 DIAGNOSIS — F900 Attention-deficit hyperactivity disorder, predominantly inattentive type: Secondary | ICD-10-CM

## 2020-09-16 NOTE — Progress Notes (Signed)
   Subjective:    Patient ID: Lori Barrett, female    DOB: 10-20-1965, 55 y.o.   MRN: [id number]  HPI Here with her mother to follow up on depression with anxiety and ADHD. She has continued to take the same medications. Valen thinks she is doing better, but her mother disagrees. Her other says her memory continues to be a problem. She still cannot work or drive. Apparently her moods have been stable. Her BP is stable. She met with Jeylin Seller DNP at [facility] on 07-22-20, but I do not have these records to review. Santa and her mother said that no suggestions were made to change any of her medications and no follow up plan was discussed.    Review of Systems  Constitutional: Negative.   Respiratory: Negative.   Cardiovascular: Negative.        Objective:   Physical Exam Constitutional:      Appearance: Normal appearance.  Cardiovascular:     Rate and Rhythm: Normal rate and regular rhythm.     Pulses: Normal pulses.     Heart sounds: Normal heart sounds.  Pulmonary:     Effort: Pulmonary effort is normal.     Breath sounds: Normal breath sounds.  Neurological:     General: No focal deficit present.     Mental Status: She is alert and oriented to person, place, and time. Mental status is at baseline.  Psychiatric:        Mood and Affect: Mood normal.        Behavior: Behavior normal.        Thought Content: Thought content normal.        Judgment: Judgment normal.           Assessment & Plan:  Her HTN, ADHD, and depression with anxiety are stable. We refilled the Adderall as before. I asked them to sign a release so we can obtain office notes from the psychiatrist. The HTN is well controlled. Alysia Penna, MD

## 2020-09-17 ENCOUNTER — Encounter: Payer: Self-pay | Admitting: Physician Assistant

## 2020-09-17 ENCOUNTER — Ambulatory Visit: Payer: 59 | Admitting: Physician Assistant

## 2020-09-17 DIAGNOSIS — R413 Other amnesia: Secondary | ICD-10-CM

## 2020-09-17 NOTE — Progress Notes (Signed)
Assessment/Plan:   55 y.o. year old female with risk factors including hypertension, anxiety, depression, ADHD inattentive type and tobacco abuse seen today for evaluation of memory loss.  The patient was here with her mother today, Lori Barrett who wanted this evaluation, as she believes that her memory was getting worse on a daily basis.  However, the patient refused to participate in this visit. MoCA was aborted, due to lack of cooperation.  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  Mother and daughter became very argumentative to each other, and daughter was explained repetitively that  was in her best interest to participate in this visit, giving [place] information about her. Patient continuously refused.   On 04/20/2020, the patient had an appointment with Dr. Rosann Auerbach, psychologist to undergo a neurocognitive exam, and canceled the visit.  Today, I tried to once again do a MoCA exam and the patient did not want to participate.  This was called to the attention of Rada Hay, office administrator, who kindly talked to both in trying to help managing the escalating situation. While initially the patient agreed to go to ahead with the MoCA, she aborted again, and I explained to the mother that if the patient is not willing to let us do the best we can to help her, we will have to cancel this visit. The patient told her mother that wanted to leave, once again they began to argue, and mother asked me to help her daughter.  I explained that perhaps she needs better control of her psychiatric needs, and medication adherence, in order to proceed to these neurological testing.  The mother became very tearful, raising her voice and the patient continued to be argumentative.  The visit then was discontinued, and in view of no physical exam having been performed, no testing was performed/ finsihed, there will be no charges for this  appointment.  Should the patient become cooperative, she will be welcome to make a new appointment to better care for her neurological needs.  Subjective:   Unable to perform, patient not cooperative    "MRI 03/25/20   No evidence of recent infarction, hemorrhage, or mass.  No significant parenchymal volume loss.  Mild chronic microvascular ischemic changes. Few chronic small vessel infarcts or prominent perivascular spaces along the deep right cerebral white matter.  CT head on 03/30/20 taken at [facility] for [facility]  1. No acute intracranial abnormality. 2. 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 Lisinopril Cough  . Shellfish Allergy Other (See Comments)    Unknown reaction    Current Outpatient Medications  Medication Instructions  . ALPRAZolam (XANAX) 1 MG tablet Take one tablet one hour prior to an MRI scan  . amLODipine (NORVASC) 10 mg, Oral, Daily  . amphetamine-dextroamphetamine (ADDERALL XR) 20 MG 24 hr capsule 20 mg, Oral, BH-each morning  . escitalopram (LEXAPRO) 10 mg, Oral, Daily  . losartan-hydrochlorothiazide (HYZAAR) 50-12.5 MG tablet 1 tablet, Oral, Daily  . Multiple Vitamins-Minerals ([facility] SPECTRAVITE ADULTS) TABS 1 tablet, Oral, Daily  . QUEtiapine (SEROQUEL) 100 MG tablet TAKE 1 TABLET BY MOUTH EVERYDAY AT BEDTIME     VITALS:  There were no vitals filed for this visit. Depression screen [facility] 2/9  01/23/2019 01/23/2019  Decreased Interest 0 0  Down, Depressed, Hopeless 0 0  PHQ - 2 Score 0 0    Unable to perform physical exam, patient uncooperative    Cc:  Lori Salisbury, Lori Barrett  Lori Barrett 09/17/2020 3:56 PM

## 2020-10-06 ENCOUNTER — Telehealth: Payer: Self-pay | Admitting: Family Medicine

## 2020-10-06 NOTE — Telephone Encounter (Signed)
Pt is calling in needing a refill on Rx amphetamine-dextroamphetamine (ADDERALL XR) 20 MG  [facility] [address]

## 2020-10-06 NOTE — Telephone Encounter (Signed)
Last office visit- 09/16/20 Last refill- 09/02/20  No future visit scheduled

## 2020-10-07 MED ORDER — AMPHETAMINE-DEXTROAMPHET ER 20 MG PO CP24: 20.0000 mg | ORAL_CAPSULE | ORAL | 0 refills | Status: DC

## 2020-10-07 NOTE — Telephone Encounter (Signed)
Done

## 2020-10-08 NOTE — Telephone Encounter (Signed)
Message complete, nothing further needed

## 2020-10-11 NOTE — Progress Notes (Deleted)
Assessment/Plan:   Dementia  Discussed the diagnosis of dementia, likely due to Alzheimer's disease. Discussed safety both in and out of the home.  Discussed the importance of regular daily schedule with inclusion of crossword puzzles to maintain brain function.  Continue to monitor mood. Say active exercising  at least 30 minutes at least 3 times a week.  Naps should be scheduled and should be no longer than 60 minutes and should not occur after 2 PM.  Mediterranean diet is recommended  We discussed that there is no evidence that medications such as Donepezil have any significant effect in MCI and sometimes side effects are hard to tolerate. They can alter the slope of progression for a short period of time in dementia and patients may have better function longer.  Side effects include nausea, vomiting, diarrhea, vivid dreams, and muscle cramps.  Patient and spouse agree and know to call the office if any of these symptoms appear.  We will start donepezil (Aricept) 5mg  daily for 2 weeks.  If tolerated  will increase the dose to 10mg  daily.     Follow up in   months.   Case discussed with Dr. who agrees with the plan     Subjective:   Lori Barrett  is a 55 y.o.female seen today in follow up for memory loss.  This patient is accompanied in the office by her ***who supplements the history.  Previous records as well as any outside records available were reviewed prior to todays visit.  Patient is currently on ***. Patient feels that memory is Patient lives with  Memory Lives with Mood Depression Irritability Sleeps Vivid Dreams Sleepwalking Bathing Dressing Medications Finances Denies living objects in unusual places. Appetite  trouble swallowing.  Cooks. Denies leaving the stove on or the faucet on. Ambulates without difficulty without walker or cane. Drive with GPS denies getting lost.  Patient no longer drives. Denies headaches, trauma, or injuries to the head,  double vision, dizziness, focal numbness or tingling, unilateral weakness or tremors. Denies urine incontinence or retention. Denies constipation or diarrhea. Denies history of OSA, ETOH   Tobacco. Family History    PREVIOUS MEDICATIONS: ***  CURRENT MEDICATIONS:  Outpatient Encounter Medications as of 10/12/2020  Medication Sig   ALPRAZolam (XANAX) 1 MG tablet Take one tablet one hour prior to an MRI scan (Patient taking differently: Take 1 mg by mouth daily as needed for anxiety (prior to MRI scan).)   amLODipine (NORVASC) 10 MG tablet Take 1 tablet (10 mg total) by mouth daily.   [START ON 12/07/2020] amphetamine-dextroamphetamine (ADDERALL XR) 20 MG 24 hr capsule Take 1 capsule (20 mg total) by mouth every morning.   escitalopram (LEXAPRO) 10 MG tablet Take 1 tablet (10 mg total) by mouth daily.   losartan-hydrochlorothiazide (HYZAAR) 50-12.5 MG tablet Take 1 tablet by mouth daily.   Multiple Vitamins-Minerals ([employer] SPECTRAVITE ADULTS) TABS Take 1 tablet by mouth daily.   QUEtiapine (SEROQUEL) 100 MG tablet TAKE 1 TABLET BY MOUTH EVERYDAY AT BEDTIME   No facility-administered encounter medications on file as of 10/12/2020.     Objective:     PHYSICAL EXAMINATION:    VITALS:  There were no vitals filed for this visit.  GEN:  The patient appears stated age and is in NAD. HEENT:  Normocephalic, atraumatic.   Neurological examination:  General: NAD, well-groomed, appears stated age. Orientation: lert and oriented to person, place and time.   Cranial nerves: There is good facial symmetry.The speech is  fluent and clear***. No aphasia or dysarthria. Fund of knowledge is appropriate. Recent and remote memory are intact. Attention and concentration are normal. Able to name objects and repeat phrases.  Hearing is intact to conversational tone.    Sensation: Sensation is intact to light touch throughout Motor: Strength is at least antigravity x4.  No flowsheet data found.   No  flowsheet data found.    Movement examination: Tone: There is normal tone in the UE/LE Abnormal movements:  no tremor.  No myoclonus.  No asterixis.   Coordination:  There is no decremation with RAM's. Normal finger to nose  Gait and Station: The patient has no difficulty arising out of a deep-seated chair without the use of the hands. The patient's stride length is good.  Gait is cautious and narrow.    CBC CBC Latest Ref Rng & Units 03/30/2020 02/21/2020 10/01/2013  WBC 4.0 - 10.5 K/uL 11.1(H) 9.7 13.8(H)  Hemoglobin 12.0 - 15.0 g/dL 15.7(H) 15.8(H) 14.3  Hematocrit 36.0 - 46.0 % 45.3 45.2(H) 41.8  Platelets 150 - 400 K/uL 421(H) 472(H) 399.0     CMP Latest Ref Rng & Units 03/30/2020 02/21/2020 10/01/2013  Glucose 70 - 99 mg/dL 950(D) 98 326(Z)  BUN 6 - 20 mg/dL 5(L) 8 12  Creatinine 1.24 - 1.00 mg/dL 5.80 9.98 0.6  Sodium 338 - 145 mmol/L 138 140 132(L)  Potassium 3.5 - 5.1 mmol/L 3.4(L) 4.4 3.5  Chloride 98 - 111 mmol/L 99 101 97  CO2 22 - 32 mmol/L 31 28 28   Calcium 8.9 - 10.3 mg/dL 9.8 9.2  Total Protein 6.5 - 8.1 g/dL 7.6 7.5 7.5  Total Bilirubin 0.3 - 1.2 mg/dL 0.3 0.5 0.5  Alkaline Phos 38 - 126 U/L 60 - 56  AST 15 - 41 U/L 20 20 25   ALT 0 - 44 U/L 21 17 19        Total time spent on today's visit was *** minutes, including both face-to-face time and nonface-to-face time.  Time included that spent on review of records (prior notes available to me/labs/imaging if pertinent), discussing treatment and goals, answering patient's questions and coordinating care.  Cc:  25.0, MD , PA-C

## 2020-10-12 ENCOUNTER — Other Ambulatory Visit: Payer: Self-pay

## 2020-10-12 ENCOUNTER — Ambulatory Visit: Payer: 59 | Admitting: Physician Assistant

## 2020-10-12 ENCOUNTER — Encounter: Payer: Self-pay | Admitting: Physician Assistant

## 2020-10-12 VITALS — BP 135/62 | HR 120 | Ht 63.0 in | Wt 141.0 lb

## 2020-10-12 DIAGNOSIS — F900 Attention-deficit hyperactivity disorder, predominantly inattentive type: Secondary | ICD-10-CM | POA: Diagnosis not present

## 2020-10-12 NOTE — Patient Instructions (Addendum)
It was a pleasure to see you today at our office.   Recommendations:  Neurocognitive evaluation at our office    RECOMMENDATIONS FOR ALL PATIENTS WITH MEMORY PROBLEMS: 1. Continue to exercise (Recommend 30 minutes of walking everyday, or 3 hours every week) 2. Increase social interactions - continue going to [place] and enjoy social gatherings with friends and family 3. Eat healthy, avoid fried foods and eat more fruits and vegetables 4. Maintain adequate blood pressure, blood sugar, and blood cholesterol level. Reducing the risk of stroke and cardiovascular disease also helps promoting better memory. 5. Avoid stressful situations. Live a simple life and avoid aggravations. Organize your time and prepare for the next day in anticipation. 6. Sleep well, avoid any interruptions of sleep and avoid any distractions in the bedroom that may interfere with adequate sleep quality 7. Avoid sugar, avoid sweets as there is a strong link between excessive sugar intake, diabetes, and cognitive impairment We discussed the Mediterranean diet, which has been shown to help patients reduce the risk of progressive memory disorders and reduces cardiovascular risk. This includes eating fish, eat fruits and green leafy vegetables, nuts like almonds and hazelnuts, walnuts, and also use olive oil. Avoid fast foods and fried foods as much as possible. Avoid sweets and sugar as sugar use has been linked to worsening of memory function.  There is always a concern of gradual progression of memory problems. If this is the case, then we may need to adjust level of care according to patient needs. Support, both to the patient and caregiver, should then be put into place.      You have been referred for a neuropsychological evaluation (i.e., evaluation of memory and thinking abilities). Please bring someone with you to this appointment if possible, as it is helpful for the doctor to hear from both you and another adult who  knows you well. Please bring eyeglasses and hearing aids if you wear them.    The evaluation will take approximately 3 hours and has two parts:   The first part is a clinical interview with the neuropsychologist (Dr. Milbert Coulter or Dr. Roseanne Reno). During the interview, the neuropsychologist will speak with you and the individual you brought to the appointment.    The second part of the evaluation is testing with the doctor's technician Annabelle Harman or Selena Batten). During the testing, the technician will ask you to remember different types of material, solve problems, and answer some questionnaires. Your family member will not be present for this portion of the evaluation.   Please note: We must reserve several hours of the neuropsychologist's time and the psychometrician's time for your evaluation appointment. As such, there is a No-Show fee of $100. If you are unable to attend any of your appointments, please contact our office as soon as possible to reschedule.    FALL PRECAUTIONS: Be cautious when walking. Scan the area for obstacles that may increase the risk of trips and falls. When getting up in the mornings, sit up at the edge of the bed for a few minutes before getting out of bed. Consider elevating the bed at the head end to avoid drop of blood pressure when getting up. Walk always in a well-lit room (use night lights in the walls). Avoid area rugs or power cords from appliances in the middle of the walkways. Use a walker or a cane if necessary and consider physical therapy for balance exercise. Get your eyesight checked regularly.  FINANCIAL OVERSIGHT: Supervision, especially oversight when making financial  decisions or transactions is also recommended.  HOME SAFETY: Consider the safety of the kitchen when operating appliances like stoves, microwave oven, and blender. Consider having supervision and share cooking responsibilities until no longer able to participate in those. Accidents with firearms and other  hazards in the house should be identified and addressed as well.   ABILITY TO BE LEFT ALONE: If patient is unable to contact 911 operator, consider using LifeLine, or when the need is there, arrange for someone to stay with patients. Smoking is a fire hazard, consider supervision or cessation. Risk of wandering should be assessed by caregiver and if detected at any point, supervision and safe proof recommendations should be instituted.  MEDICATION SUPERVISION: Inability to self-administer medication needs to be constantly addressed. Implement a mechanism to ensure safe administration of the medications.   DRIVING: Regarding driving, in patients with progressive memory problems, driving will be impaired. We advise to have someone else do the driving if trouble finding directions or if minor accidents are reported. Independent driving assessment is available to determine safety of driving.   If you are interested in the driving assessment, you can contact the following:  [employer] in [place] [phone]  [facility] [phone]  [facility] [phone] or [phone]    Mediterranean Diet A Mediterranean diet refers to food and lifestyle choices that are based on the traditions of countries located on the [employer]. This way of eating has been shown to help prevent certain conditions and improve outcomes for people who have chronic diseases, like kidney disease and heart disease. What are tips for following this plan? Lifestyle  Cook and eat meals together with your family, when possible. Drink enough fluid to keep your urine clear or pale yellow. Be physically active every day. This includes: Aerobic exercise like running or swimming. Leisure activities like gardening, walking, or housework. Get 7-8 hours of sleep each night. If recommended by your health care provider, drink red wine in  moderation. This means 1 glass a day for nonpregnant women and 2 glasses a day for men. A glass of wine equals 5 oz (150 mL). Reading food labels  Check the serving size of packaged foods. For foods such as rice and pasta, the serving size refers to the amount of cooked product, not dry. Check the total fat in packaged foods. Avoid foods that have saturated fat or trans fats. Check the ingredients list for added sugars, such as corn syrup. Shopping  At the grocery store, buy most of your food from the areas near the walls of the store. This includes: Fresh fruits and vegetables (produce). Grains, beans, nuts, and seeds. Some of these may be available in unpackaged forms or large amounts (in bulk). Fresh seafood. Poultry and eggs. Low-fat dairy products. Buy whole ingredients instead of prepackaged foods. Buy fresh fruits and vegetables in-season from local farmers markets. Buy frozen fruits and vegetables in resealable bags. If you do not have access to quality fresh seafood, buy precooked frozen shrimp or canned fish, such as tuna, salmon, or sardines. Buy small amounts of raw or cooked vegetables, salads, or olives from the deli or salad bar at your store. Stock your pantry so you always have certain foods on hand, such as olive oil, canned tuna, canned tomatoes, rice, pasta, and beans. Cooking  Cook foods with extra-virgin olive oil instead of using butter or other vegetable oils. Have meat as a side dish, and have vegetables or  grains as your main dish. This means having meat in small portions or adding small amounts of meat to foods like pasta or stew. Use beans or vegetables instead of meat in common dishes like chili or lasagna. Experiment with different cooking methods. Try roasting or broiling vegetables instead of steaming or sauteing them. Add frozen vegetables to soups, stews, pasta, or rice. Add nuts or seeds for added healthy fat at each meal. You can add these to yogurt,  salads, or vegetable dishes. Marinate fish or vegetables using olive oil, lemon juice, garlic, and fresh herbs. Meal planning  Plan to eat 1 vegetarian meal one day each week. Try to work up to 2 vegetarian meals, if possible. Eat seafood 2 or more times a week. Have healthy snacks readily available, such as: Vegetable sticks with hummus. Greek yogurt. Fruit and nut trail mix. Eat balanced meals throughout the week. This includes: Fruit: 2-3 servings a day Vegetables: 4-5 servings a day Low-fat dairy: 2 servings a day Fish, poultry, or lean meat: 1 serving a day Beans and legumes: 2 or more servings a week Nuts and seeds: 1-2 servings a day Whole grains: 6-8 servings a day Extra-virgin olive oil: 3-4 servings a day Limit red meat and sweets to only a few servings a month What are my food choices? Mediterranean diet Recommended Grains: Whole-grain pasta. Brown rice. Bulgar wheat. Polenta. Couscous. Whole-wheat bread. Orpah Cobb. Vegetables: Artichokes. Beets. Broccoli. Cabbage. Carrots. Eggplant. Green beans. Chard. Kale. Spinach. Onions. Leeks. Peas. Squash. Tomatoes. Peppers. Radishes. Fruits: Apples. Apricots. Avocado. Berries. Bananas. Cherries. Dates. Figs. Grapes. Lemons. Melon. Oranges. Peaches. Plums. Pomegranate. Meats and other protein foods: Beans. Almonds. Sunflower seeds. Pine nuts. Peanuts. Cod. Salmon. Scallops. Shrimp. Tuna. Tilapia. Clams. Oysters. Eggs. Dairy: Low-fat milk. Cheese. Greek yogurt. Beverages: Water. Red wine. Herbal tea. Fats and oils: Extra virgin olive oil. Avocado oil. Grape seed oil. Sweets and desserts: [place] yogurt with honey. Baked apples. Poached pears. Trail mix. Seasoning and other foods: Basil. Cilantro. Coriander. Cumin. Mint. Parsley. Sage. Rosemary. Tarragon. Garlic. Oregano. Thyme. Pepper. Balsalmic vinegar. Tahini. Hummus. Tomato sauce. Olives. Mushrooms. Limit these Grains: Prepackaged pasta or rice dishes. Prepackaged cereal with  added sugar. Vegetables: Deep fried potatoes (french fries). Fruits: Fruit canned in syrup. Meats and other protein foods: Beef. Pork. Lamb. Poultry with skin. Hot dogs. Tomasa Blase. Dairy: Ice cream. Sour cream. Whole milk. Beverages: Juice. Sugar-sweetened soft drinks. Beer. Liquor and spirits. Fats and oils: Butter. Canola oil. Vegetable oil. Beef fat (tallow). Lard. Sweets and desserts: Cookies. Cakes. Pies. Candy. Seasoning and other foods: Mayonnaise. Premade sauces and marinades. The items listed may not be a complete list. Talk with your dietitian about what dietary choices are right for you. Summary The Mediterranean diet includes both food and lifestyle choices. Eat a variety of fresh fruits and vegetables, beans, nuts, seeds, and whole grains. Limit the amount of red meat and sweets that you eat. Talk with your health care provider about whether it is safe for you to drink red wine in moderation. This means 1 glass a day for nonpregnant women and 2 glasses a day for men. A glass of wine equals 5 oz (150 mL). This information is not intended to replace advice given to you by your health care provider. Make sure you discuss any questions you have with your health care provider. Document Released: 11/26/2015 Document Revised: 12/29/2015 Document Reviewed: 11/26/2015 Elsevier Interactive Patient Education  2017 [employer].

## 2020-10-12 NOTE — Progress Notes (Addendum)
Assessment/Plan:   Memory  Issues, likely due to ADHD  Lori Barrett is a 55 y.o. year old female with risk factors including hypertension, hyperlipidemia, CAD, anxiety, depression and  tobacco seen today for evaluation of  possible memory loss.  Recent MRI brain was negative for volume loss or other acute findings. Neuropsych evaluation was not indicative of dementia but of ADHD nature. Psychiatric evaluation with medicine adjustment seems to have a better curb on her behavior with adherence to regimen. MoCA today is incomplete due to patient's inability to concentrate on a task, losing patience to finish the test. Tried MMSE as well with same response.   Recommend to have a close psychiatric follow up, patient and mother agreed with the plan Follow up as needed       Subjective:    The patient is seen in neurologic consultation at the request of Lori Salisbury, MD for the evaluation of memory. The patient is here with her mother today, Lori Barrett, who believes that her memory is getting worse on daily basis.  Of note, on 09/17/2020, the patient had presented for evaluation however due to lack of cooperation from the patient, this visit was aborted.  Prior to that, on 04/07/2020,she also underwent a neuropsychological evaluation but did not wish to complete, although the available information yielded negative for dementia, with memory loss likely due to severe ADHD,  although her mother did not seem to comprehend this being the case. In the interim she had a Psych evaluation with medicine adjustment and she seem to have a better curb on her behavior. She seems to adhere to regimen. However, during the visit, patient did not want to cooperate answering the ROS, stating "nothing is wrong with me except my attention deficit and my mother is driving me crazy". "Everything else is OK and I hear what I want to hear" In view that the patient does not want to participate,I had a discussion with Dr.  Rachel Bo regarding reevaluation. Ideally, bet 1-2 years after the first exam, repeat Neuropsych evaluation is recommended, but given that she did not want to participate in a MoCA or MMSE, instead, a close psychiatric follow up is indicated.  This was discussed with patient and her mother, who wanted to have a diagnosis "today so I can prepare for the future in case I die". I explained that recent MRI of the brain is unremarkable for dementia, and that her memory issues are more likely related to her daughters ADHD. She stated that her daughter does not have ADHD. I repeatedly stated that there is no dementia on this patient at this time.   After several attempts, patient and mother agreed that psychiatric follow up will be the route to take.      MRI 03/25/20    No evidence of recent infarction, hemorrhage, or mass.   No significant parenchymal volume loss.   Mild chronic microvascular ischemic changes. Few chronic small vessel infarcts or prominent perivascular spaces along the deep right cerebral white matter.   CT head on 03/30/20 taken at [facility] for [facility]  1. No acute intracranial abnormality. 2. 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, as seen on recent MRI."     Allergies  Allergen Reactions   Lisinopril Cough   Shellfish Allergy Other (See Comments)    Unknown reaction    Current Outpatient Medications  Medication Instructions   ALPRAZolam (XANAX) 1 MG tablet Take one tablet one hour  prior to an MRI scan   amLODipine (NORVASC) 10 mg, Oral, Daily   [START ON 12/07/2020] amphetamine-dextroamphetamine (ADDERALL XR) 20 MG 24 hr capsule 20 mg, Oral, BH-each morning   escitalopram (LEXAPRO) 10 mg, Oral, Daily   losartan-hydrochlorothiazide (HYZAAR) 50-12.5 MG tablet 1 tablet, Oral, Daily   Multiple Vitamins-Minerals ([employer] SPECTRAVITE ADULTS) TABS 1 tablet, Oral, Daily   QUEtiapine (SEROQUEL) 100 MG tablet TAKE 1 TABLET BY MOUTH EVERYDAY AT  BEDTIME     VITALS:   Vitals:   10/12/20 1033  BP: 135/62  Pulse: (!) 120  SpO2: 96%  Weight: 141 lb (64 kg)  Height: 5\' 3"  (1.6 m)   Depression screen [facility] 2/9 01/23/2019 01/23/2019  Decreased Interest 0 0  Down, Depressed, Hopeless 0 0  PHQ - 2 Score 0 0    HEENT:  Normocephalic, atraumatic. The mucous membranes are moist. The superficial temporal arteries are without ropiness or tenderness. Cardiovascular: Regular rate and rhythm. Lungs: Clear to auscultation bilaterally. Neck: There are no carotid bruits noted bilaterally.  NEUROLOGICAL: MoCA and MMSE unable to perform.   Orientation:  No flowsheet data found. Alert and oriented to person, place and time. No aphasia or dysarthria. Fund of knowledge is appropriate. Recent and remote memory are intact.  Attention and concentration are impaired.  Able to name objects unable to repeat phrases, does not wish to do so Cranial nerves: There is good facial symmetry. Extraocular muscles are intact and visual fields are full to confrontational testing. Speech is fluent and clear. Soft palate rises symmetrically and there is no tongue deviation. Hearing is intact to conversational tone. Tone: Tone is good throughout. Sensation: Sensation is intact to light touch and pinprick throughout. Vibration is intact at the bilateral big toe.There is no extinction with double simultaneous stimulation. There is no sensory dermatomal level identified. Coordination: The patient has no difficulty with RAM's or FNF bilaterally. Normal finger to nose  Motor: Strength is 5/5 in the bilateral upper and lower extremities. There is no pronator drift. There are no fasciculations noted. DTR's: Deep tendon reflexes are 2/4 at the bilateral biceps, triceps, brachioradialis, patella and achilles.  Plantar responses are downgoing bilaterally. Gait and Station: The patient is able to ambulate without difficulty.The patient did not want to do heel to toe or stand in  Romberg position     CBC Latest Ref Rng & Units 03/30/2020 02/21/2020 10/01/2013  WBC 4.0 - 10.5 K/uL 11.1(H) 9.7 13.8(H)  Hemoglobin 12.0 - 15.0 g/dL 15.7(H) 15.8(H) 14.3  Hematocrit 36.0 - 46.0 % 45.3 45.2(H) 41.8  Platelets 150 - 400 K/uL 421(H) 472(H) 399.0     CMP Latest Ref Rng & Units 03/30/2020 02/21/2020 10/01/2013  Glucose 70 - 99 mg/dL 10/03/2013) 98 488(Q)  BUN 6 - 20 mg/dL 5(L) 8 12  Creatinine 916(X - 1.00 mg/dL 4.50 3.88 0.6  Sodium 8.28 - 145 mmol/L 138 140 132(L)  Potassium 3.5 - 5.1 mmol/L 3.4(L) 4.4 3.5  Chloride 98 - 111 mmol/L 99 101 97  CO2 22 - 32 mmol/L 31 28 28   Calcium 8.9 - 10.3 mg/dL 9.8 003 9.2  Total Protein 6.5 - 8.1 g/dL 7.6 7.5 7.5  Total Bilirubin 0.3 - 1.2 mg/dL 0.3 0.5 0.5  Alkaline Phos 38 - 126 U/L 60 - 56  AST 15 - 41 U/L 20 20 25   ALT 0 - 44 U/L 21 17 19       Thank you for allowing the opportunity to participate in the care of this nice  patient. Please do not hesitate to contact us for any questions or concerns.   Total time spent on today's visit was 40  including both face-to-face time and nonface-to-face time.  Time included that spent on review of records (prior notes available to me/labs/imaging if pertinent), discussing treatment and goals, answering patient's questions and coordinating care.  Cc:  Lori Salisbury, MD  Marlowe Kays 10/12/2020 1:55 PM

## 2020-10-14 ENCOUNTER — Ambulatory Visit: Payer: 59 | Admitting: Family Medicine

## 2020-10-14 ENCOUNTER — Other Ambulatory Visit: Payer: Self-pay

## 2020-10-14 ENCOUNTER — Encounter: Payer: Self-pay | Admitting: Family Medicine

## 2020-10-14 VITALS — BP 130/80 | HR 81 | Temp 98.0°F | Wt 139.5 lb

## 2020-10-14 DIAGNOSIS — F900 Attention-deficit hyperactivity disorder, predominantly inattentive type: Secondary | ICD-10-CM

## 2020-10-14 DIAGNOSIS — F418 Other specified anxiety disorders: Secondary | ICD-10-CM | POA: Diagnosis not present

## 2020-10-14 DIAGNOSIS — I10 Essential (primary) hypertension: Secondary | ICD-10-CM | POA: Diagnosis not present

## 2020-10-14 NOTE — Progress Notes (Addendum)
   Subjective:    Patient ID: Lori Barrett, female    DOB: 11-Oct-1965, 55 y.o.   MRN: [id number]  HPI Here with her mother to follow up on ADHD, HTN, and depression with anxiety. She seems to be about the same as the last time we met. She saw Bralee Seller NP of Neurology on 10-06-20 and she saw Sherrilyn Rist NP of Psychiatry on 10-12-20. Both of them agreed that her major issue is ADHD and that she does not have dementia. The neurologist did diagnose her with cognitive impairment. They agreed with her current medications and basically told her to follow up with [place]. Her moods have been stable and she is sleeping well. She did have an episode where she fainted briefly at home 3 days ago. She takes both BP medications together in the mornings. That day she stood up from a chair about 10 am and she felt lightheaded and fell back into the chair. She thinks she was out a few seconds only and then woke up again. No problems since then.    Review of Systems  Constitutional: Negative.   Respiratory: Negative.    Cardiovascular: Negative.   Neurological:  Positive for syncope.  Psychiatric/Behavioral:  Positive for decreased concentration. Negative for agitation, confusion, dysphoric mood and hallucinations. The patient is not nervous/anxious.       Objective:   Physical Exam Constitutional:      Appearance: Normal appearance.  Cardiovascular:     Rate and Rhythm: Normal rate and regular rhythm.     Pulses: Normal pulses.     Heart sounds: Normal heart sounds.  Pulmonary:     Effort: Pulmonary effort is normal.     Breath sounds: Normal breath sounds.  Neurological:     General: No focal deficit present.     Mental Status: She is alert and oriented to person, place, and time.  Psychiatric:        Mood and Affect: Mood normal.        Behavior: Behavior normal.          Assessment & Plan:  Her HTN is well controlled. It sounds like she had an orthostatic event 3 days ago. We agreed to  split up her BP medications, so she will take the Losartan HCT in the mornings and she will take the Amlodipine in the evenings. Her ADHD and depression are stable, but she is still totally unable to return to work or to drive her car. We spent 35 minutes reviewing her records and discussing these issues. She will return in 3 months.  Alysia Penna, MD

## 2020-10-29 DIAGNOSIS — Z0279 Encounter for issue of other medical certificate: Secondary | ICD-10-CM

## 2020-11-03 ENCOUNTER — Telehealth: Payer: Self-pay | Admitting: *Deleted

## 2020-11-03 NOTE — Telephone Encounter (Signed)
I left a detailed message at the pts cell number Dr Clent Ridges completed the disability forms.  Form were faxed with the last office notes to the Guardian at [phone] and sent to be scanned.

## 2020-11-17 ENCOUNTER — Telehealth: Payer: Self-pay | Admitting: Family Medicine

## 2020-11-17 NOTE — Telephone Encounter (Signed)
PT mother would like to pick up the last Disability Form that was sent in regards to [employer]. She would like to be called at [phone] when it is ready and would like to get it tomorrow.

## 2020-11-17 NOTE — Telephone Encounter (Signed)
Please advise 

## 2020-11-18 NOTE — Telephone Encounter (Signed)
Left detailed message for pt to pick up Guardian forms from the office.Forms placed in the front office cabinet

## 2020-11-18 NOTE — Telephone Encounter (Signed)
This is ready to be picked up

## 2020-11-25 ENCOUNTER — Telehealth: Payer: Self-pay | Admitting: Family Medicine

## 2020-11-25 NOTE — Telephone Encounter (Signed)
Medical Source Statement to be filled out--placed in dr's folder.

## 2020-11-26 DIAGNOSIS — Z0279 Encounter for issue of other medical certificate: Secondary | ICD-10-CM

## 2020-11-27 NOTE — Telephone Encounter (Signed)
Medical Source Statement received placed on Dr Clent Ridges folder for completion

## 2020-11-30 NOTE — Telephone Encounter (Signed)
Left pt a voicemail regarding her Medical Source Statement, advised pt that her paperwork is ready for pick up at the office

## 2020-12-07 NOTE — Telephone Encounter (Signed)
Pt paperwork placed in the front office cabinet for pick up, copy sent to scanning

## 2020-12-14 ENCOUNTER — Telehealth: Payer: Self-pay

## 2020-12-14 NOTE — Telephone Encounter (Signed)
Pt Medical assessment for has been faxed as requested by pt, copy sent to scanning

## 2020-12-16 ENCOUNTER — Other Ambulatory Visit: Payer: Self-pay | Admitting: Family Medicine

## 2020-12-16 DIAGNOSIS — Z1231 Encounter for screening mammogram for malignant neoplasm of breast: Secondary | ICD-10-CM

## 2021-01-11 ENCOUNTER — Encounter: Payer: Self-pay | Admitting: Family Medicine

## 2021-01-11 ENCOUNTER — Ambulatory Visit: Payer: 59 | Admitting: Family Medicine

## 2021-01-11 ENCOUNTER — Other Ambulatory Visit: Payer: Self-pay

## 2021-01-11 VITALS — BP 118/78 | HR 114 | Temp 98.3°F | Wt 140.0 lb

## 2021-01-11 DIAGNOSIS — I10 Essential (primary) hypertension: Secondary | ICD-10-CM | POA: Diagnosis not present

## 2021-01-11 DIAGNOSIS — F900 Attention-deficit hyperactivity disorder, predominantly inattentive type: Secondary | ICD-10-CM

## 2021-01-11 DIAGNOSIS — F418 Other specified anxiety disorders: Secondary | ICD-10-CM

## 2021-01-11 MED ORDER — AMPHETAMINE-DEXTROAMPHET ER 20 MG PO CP24: 20.0000 mg | ORAL_CAPSULE | ORAL | 0 refills | Status: DC

## 2021-01-11 NOTE — Progress Notes (Signed)
   Subjective:    Patient ID: [name], female    DOB: [birth year], 55 y.o.   MRN: [id number]  HPI Here with her mother to follow up on ADHD, depression with anxiety, and HTN. Since we last saw her she has done fairly well. Her BP has been well controlled and she has not had any more orthostatic spells since we spread her medications out. Her moods are good and her ADHD is about the same.    Review of Systems  Constitutional: Negative.   Respiratory: Negative.    Cardiovascular: Negative.   Psychiatric/Behavioral: Negative.        Objective:   Physical Exam Constitutional:      Appearance: Normal appearance.  Cardiovascular:     Rate and Rhythm: Normal rate and regular rhythm.     Pulses: Normal pulses.     Heart sounds: Normal heart sounds.  Pulmonary:     Effort: Pulmonary effort is normal.     Breath sounds: Normal breath sounds.  Neurological:     General: No focal deficit present.     Mental Status: She is alert and oriented to person, place, and time.  Psychiatric:        Mood and Affect: Mood normal.          Assessment & Plan:  Her depression with anxiety, ADHD, and HTN are stable. She will return in November for a complete well exam.  Gershon Crane, MD

## 2021-01-15 ENCOUNTER — Telehealth: Payer: Self-pay | Admitting: Physician Assistant

## 2021-01-15 NOTE — Telephone Encounter (Signed)
Pt mother called in, would like a call to discuss some things regarding her daughter. Said she has the report, but doesn't know how to read it. She wants to know if sara checked her for dementia/ memory test [phone]

## 2021-01-18 NOTE — Telephone Encounter (Signed)
Patient will have to sign release for records

## 2021-01-18 NOTE — Telephone Encounter (Signed)
Tried x 2 no answer at 01/18/2021 at 2:08pm

## 2021-01-18 NOTE — Telephone Encounter (Signed)
Pt's mother called back in had not heard back from [place]. Looks like she is wanting to find out the results of the [facility] the patient did back in June.

## 2021-01-19 ENCOUNTER — Other Ambulatory Visit: Payer: Self-pay

## 2021-01-19 NOTE — Telephone Encounter (Signed)
Patient was not able to complete test. Will tell mother when she calls, no answer at 1048 01/19/2021, I will call back in a bit.

## 2021-01-19 NOTE — Telephone Encounter (Signed)
Pt mother called back. She said she doesn't need the records, she already has them. She wants to know whether or not we gave her a dementia test. She said she cant read the records, hence why she is calling to get what is said.

## 2021-01-19 NOTE — Telephone Encounter (Signed)
Left message to call office at 1:11  01/19/2021

## 2021-01-19 NOTE — Telephone Encounter (Signed)
Left message for patient to stop by office and sign release to get information requested.

## 2021-01-19 NOTE — Telephone Encounter (Signed)
Left message again to contact office, unable to complete testing.

## 2021-01-27 ENCOUNTER — Ambulatory Visit: Payer: 59 | Admitting: Family Medicine

## 2021-01-27 ENCOUNTER — Other Ambulatory Visit: Payer: Self-pay

## 2021-01-27 ENCOUNTER — Encounter: Payer: Self-pay | Admitting: Physician Assistant

## 2021-01-27 ENCOUNTER — Encounter: Payer: Self-pay | Admitting: Family Medicine

## 2021-01-27 VITALS — BP 128/88 | HR 108 | Temp 99.0°F | Wt 143.1 lb

## 2021-01-27 DIAGNOSIS — F418 Other specified anxiety disorders: Secondary | ICD-10-CM | POA: Diagnosis not present

## 2021-01-27 MED ORDER — FLUOXETINE HCL 40 MG PO CAPS: 40.0000 mg | ORAL_CAPSULE | Freq: Every day | ORAL | 3 refills | Status: DC

## 2021-01-27 MED ORDER — QUETIAPINE FUMARATE 100 MG PO TABS: ORAL_TABLET | ORAL | 1 refills | Status: DC

## 2021-01-27 NOTE — Progress Notes (Signed)
   Subjective:    Patient ID: Lori Barrett, female    DOB: 1966/01/02, 55 y.o.   MRN: [id number]  HPI Here with her mother to discuss depression. Over the pat month or so she says she has been more depressed and much more irritable than usual. Her mother agrees that she becomes very angry very quickly. She continues to take Lexapro daily, but she is not taking Quetiapine. We prescribed this for her in March and it immediately helped her. However at some point she stopped taking it and they do not know why. She denies any suicidal thoughts.    Review of Systems  Constitutional: Negative.   Respiratory: Negative.    Cardiovascular: Negative.   Psychiatric/Behavioral:  Positive for agitation, decreased concentration, dysphoric mood and sleep disturbance. Negative for behavioral problems, confusion, hallucinations, self-injury and suicidal ideas. The patient is nervous/anxious.       Objective:   Physical Exam Constitutional:      Appearance: Normal appearance.  Cardiovascular:     Rate and Rhythm: Normal rate and regular rhythm.     Pulses: Normal pulses.     Heart sounds: Normal heart sounds.  Pulmonary:     Effort: Pulmonary effort is normal.     Breath sounds: Normal breath sounds.  Neurological:     General: No focal deficit present.     Mental Status: She is alert and oriented to person, place, and time.  Psychiatric:        Thought Content: Thought content normal.     Comments: Fairly anxious           Assessment & Plan:  Depression with anxiety. We will stop Lexapro and begin Prozac 40 mg daily. We will also start her back on Quetiapine at bedtime. I urged them to not stop any medications without consulting with [place] first. She will report back in 3-4 weeks.  Gershon Crane, MD

## 2021-01-28 ENCOUNTER — Ambulatory Visit
Admission: RE | Admit: 2021-01-28 | Discharge: 2021-01-28 | Disposition: A | Discharge status: Home / Self Care | Payer: 59 | Source: Ambulatory Visit | Attending: Family Medicine | Admitting: Family Medicine

## 2021-01-28 DIAGNOSIS — Z1231 Encounter for screening mammogram for malignant neoplasm of breast: Secondary | ICD-10-CM

## 2021-01-29 ENCOUNTER — Other Ambulatory Visit: Payer: Self-pay | Admitting: Family Medicine

## 2021-01-29 DIAGNOSIS — R928 Other abnormal and inconclusive findings on diagnostic imaging of breast: Secondary | ICD-10-CM

## 2021-02-01 ENCOUNTER — Other Ambulatory Visit: Payer: Self-pay | Admitting: Family Medicine

## 2021-02-09 ENCOUNTER — Telehealth: Payer: Self-pay | Admitting: Family Medicine

## 2021-02-09 NOTE — Telephone Encounter (Signed)
Pt mom [name] is calling and does not want to make an appt for her daughter and calling to let dr fry know her daughter hair is falling out and thinks it may be some of her medications. Please advise

## 2021-02-10 NOTE — Telephone Encounter (Signed)
Last office visit- 01/27/21.  Please advise 

## 2021-02-10 NOTE — Telephone Encounter (Signed)
I reviewed common side effects of all her medications, and I don not think that is the problem. At her age a more likely explanation is menopause. Hair loss is very common then

## 2021-02-11 ENCOUNTER — Ambulatory Visit
Admission: RE | Admit: 2021-02-11 | Discharge: 2021-02-11 | Disposition: A | Discharge status: Home / Self Care | Payer: 59 | Source: Ambulatory Visit | Attending: Family Medicine | Admitting: Family Medicine

## 2021-02-11 ENCOUNTER — Other Ambulatory Visit: Payer: Self-pay

## 2021-02-11 ENCOUNTER — Ambulatory Visit: Admission: RE | Admit: 2021-02-11 | Payer: 59 | Source: Ambulatory Visit

## 2021-02-11 DIAGNOSIS — R928 Other abnormal and inconclusive findings on diagnostic imaging of breast: Secondary | ICD-10-CM

## 2021-02-11 NOTE — Telephone Encounter (Signed)
Called patient mom, lvm for call back to discuss message.

## 2021-02-11 NOTE — Telephone Encounter (Signed)
Spoke with mom, someone had spoken with her and message was given.

## 2021-02-17 ENCOUNTER — Other Ambulatory Visit: Payer: 59

## 2021-02-20 ENCOUNTER — Other Ambulatory Visit: Payer: Self-pay | Admitting: Family Medicine

## 2021-03-31 ENCOUNTER — Encounter: Payer: Self-pay | Admitting: Family Medicine

## 2021-03-31 ENCOUNTER — Ambulatory Visit (INDEPENDENT_AMBULATORY_CARE_PROVIDER_SITE_OTHER): Payer: 59 | Admitting: Family Medicine

## 2021-03-31 VITALS — BP 120/80 | HR 87 | Temp 98.5°F | Wt 149.0 lb

## 2021-03-31 DIAGNOSIS — R413 Other amnesia: Secondary | ICD-10-CM | POA: Diagnosis not present

## 2021-03-31 DIAGNOSIS — F418 Other specified anxiety disorders: Secondary | ICD-10-CM

## 2021-03-31 DIAGNOSIS — Z0000 Encounter for general adult medical examination without abnormal findings: Secondary | ICD-10-CM

## 2021-03-31 DIAGNOSIS — F900 Attention-deficit hyperactivity disorder, predominantly inattentive type: Secondary | ICD-10-CM | POA: Diagnosis not present

## 2021-03-31 LAB — HEPATIC FUNCTION PANEL
ALT: 17 U/L (ref 0–35)
AST: 17 U/L (ref 0–37)
Albumin: 4.6 g/dL (ref 3.5–5.2)
Alkaline Phosphatase: 69 U/L (ref 39–117)
Bilirubin, Direct: 0 mg/dL (ref 0.0–0.3)
Total Bilirubin: 0.4 mg/dL (ref 0.2–1.2)
Total Protein: 7.8 g/dL (ref 6.0–8.3)

## 2021-03-31 LAB — CBC WITH DIFFERENTIAL/PLATELET
Basophils Absolute: 0 10*3/uL (ref 0.0–0.1)
Basophils Relative: 0.5 % (ref 0.0–3.0)
Eosinophils Absolute: 0.1 10*3/uL (ref 0.0–0.7)
Eosinophils Relative: 1.1 % (ref 0.0–5.0)
HCT: 38.4 % (ref 36.0–46.0)
Hemoglobin: 13.2 g/dL (ref 12.0–15.0)
Lymphocytes Relative: 25.9 % (ref 12.0–46.0)
Lymphs Abs: 1.9 10*3/uL (ref 0.7–4.0)
MCHC: 34.3 g/dL (ref 30.0–36.0)
MCV: 93.1 fl (ref 78.0–100.0)
Monocytes Absolute: 0.9 10*3/uL (ref 0.1–1.0)
Monocytes Relative: 11.7 % (ref 3.0–12.0)
Neutro Abs: 4.6 10*3/uL (ref 1.4–7.7)
Neutrophils Relative %: 60.8 % (ref 43.0–77.0)
Platelets: 376 10*3/uL (ref 150.0–400.0)
RBC: 4.13 Mil/uL (ref 3.87–5.11)
RDW: 13.1 % (ref 11.5–15.5)
WBC: 7.5 10*3/uL (ref 4.0–10.5)

## 2021-03-31 LAB — LIPID PANEL
Cholesterol: 176 mg/dL (ref 0–200)
HDL: 77.5 mg/dL (ref >39.00)
LDL Cholesterol: 85 mg/dL (ref 0–99)
NonHDL: 98.28
Total CHOL/HDL Ratio: 2
Triglycerides: 64 mg/dL (ref 0.0–149.0)
VLDL: 12.8 mg/dL (ref 0.0–40.0)

## 2021-03-31 LAB — BASIC METABOLIC PANEL
BUN: 13 mg/dL (ref 6–23)
CO2: 29 mEq/L (ref 19–32)
Calcium: 9.7 mg/dL (ref 8.4–10.5)
Chloride: 99 mEq/L (ref 96–112)
Creatinine, Ser: 0.62 mg/dL (ref 0.40–1.20)
GFR: 99.25 mL/min (ref >60.00)
Glucose, Bld: 96 mg/dL (ref 70–99)
Potassium: 3.6 mEq/L (ref 3.5–5.1)
Sodium: 138 mEq/L (ref 135–145)

## 2021-03-31 LAB — TSH: TSH: 6.24 u[IU]/mL — ABNORMAL HIGH (ref 0.35–5.50)

## 2021-03-31 LAB — HEMOGLOBIN A1C: Hgb A1c MFr Bld: 5.8 % (ref 4.6–6.5)

## 2021-03-31 MED ORDER — AMPHETAMINE-DEXTROAMPHET ER 20 MG PO CP24: 20.0000 mg | ORAL_CAPSULE | ORAL | 0 refills | Status: DC

## 2021-03-31 MED ORDER — AMLODIPINE BESYLATE 10 MG PO TABS: 10.0000 mg | ORAL_TABLET | Freq: Every day | ORAL | 3 refills | Status: AC

## 2021-03-31 NOTE — Progress Notes (Signed)
° °  Subjective:    Patient ID: Lori Barrett, female    DOB: Sep 01, 1965, 55 y.o.   MRN: [id number]  HPI Here with her mother, [name], for a well exam. She feels fine physically. Her ADHD and depression with anxiety seem to be stable, but her mother is still very concerned about her memory loss. She says this is getting worse all the time, though Genevra disputes this.    Review of Systems  Constitutional: Negative.   HENT: Negative.    Eyes: Negative.   Respiratory: Negative.    Cardiovascular: Negative.   Gastrointestinal: Negative.   Genitourinary:  Negative for decreased urine volume, difficulty urinating, dyspareunia, dysuria, enuresis, flank pain, frequency, hematuria, pelvic pain and urgency.  Musculoskeletal: Negative.   Skin: Negative.   Neurological: Negative.  Negative for headaches.  Psychiatric/Behavioral:  Positive for agitation and confusion.       Objective:   Physical Exam Constitutional:      General: She is not in acute distress.    Appearance: Normal appearance. She is well-developed.  HENT:     Head: Normocephalic and atraumatic.     Right Ear: External ear normal.     Left Ear: External ear normal.     Nose: Nose normal.     Mouth/Throat:     Pharynx: No oropharyngeal exudate.  Eyes:     General: No scleral icterus.    Conjunctiva/sclera: Conjunctivae normal.     Pupils: Pupils are equal, round, and reactive to light.  Neck:     Thyroid: No thyromegaly.     Vascular: No JVD.  Cardiovascular:     Rate and Rhythm: Normal rate and regular rhythm.     Heart sounds: Normal heart sounds. No murmur heard.   No friction rub. No gallop.  Pulmonary:     Effort: Pulmonary effort is normal. No respiratory distress.     Breath sounds: Normal breath sounds. No wheezing or rales.  Chest:     Chest wall: No tenderness.  Abdominal:     General: Bowel sounds are normal. There is no distension.     Palpations: Abdomen is soft. There is no mass.      Tenderness: There is no abdominal tenderness. There is no guarding or rebound.  Musculoskeletal:        General: No tenderness. Normal range of motion.     Cervical back: Normal range of motion and neck supple.  Lymphadenopathy:     Cervical: No cervical adenopathy.  Skin:    General: Skin is warm and dry.     Findings: No erythema or rash.  Neurological:     Mental Status: She is alert and oriented to person, place, and time.     Cranial Nerves: No cranial nerve deficit.     Motor: No abnormal muscle tone.     Coordination: Coordination normal.     Deep Tendon Reflexes: Reflexes are normal and symmetric. Reflexes normal.  Psychiatric:        Behavior: Behavior normal.        Thought Content: Thought content normal.        Judgment: Judgment normal.          Assessment & Plan:  Well exam. We discussed diet and exercise. Get fasting labs. For the memory loss, we will refer her to Dr. Maggie Schwalbe (psychiatrist) at [facility] center. Gershon Crane, MD

## 2021-04-01 ENCOUNTER — Other Ambulatory Visit: Payer: Self-pay

## 2021-04-01 DIAGNOSIS — E039 Hypothyroidism, unspecified: Secondary | ICD-10-CM

## 2021-04-08 ENCOUNTER — Telehealth: Payer: Self-pay | Admitting: Family Medicine

## 2021-04-08 NOTE — Telephone Encounter (Signed)
Patient's mother called to ask which prescription was supposed to be sent in to the pharmacy last Wednesday. Lori Barrett was unavailable so I told her she will call back when available.    Good callback [phone]     Please advise

## 2021-04-15 NOTE — Telephone Encounter (Signed)
Returned pt all no option of leaving a message on mobile phone, will try later

## 2021-04-23 ENCOUNTER — Other Ambulatory Visit: Payer: Self-pay | Admitting: Family Medicine

## 2021-05-11 ENCOUNTER — Other Ambulatory Visit: Payer: Self-pay | Admitting: Family Medicine

## 2021-05-11 DIAGNOSIS — Z0279 Encounter for issue of other medical certificate: Secondary | ICD-10-CM

## 2021-05-26 ENCOUNTER — Telehealth: Payer: Self-pay | Admitting: Family Medicine

## 2021-05-26 MED ORDER — METHYLPHENIDATE HCL ER (LA) 20 MG PO CP24: 20.0000 mg | ORAL_CAPSULE | ORAL | 0 refills | Status: DC

## 2021-05-26 NOTE — Telephone Encounter (Signed)
Pt mother call and stated pt need a replacement for amphetamine 20 mg because pharmacy don't have it in and want to know what is dr. Antonietta Jewel # .

## 2021-05-26 NOTE — Telephone Encounter (Signed)
I sent in one month of Ritalin for her to try instead

## 2021-05-26 NOTE — Telephone Encounter (Signed)
Pt notified of Dr Clent Ridges advise

## 2021-05-26 NOTE — Telephone Encounter (Signed)
Requesting substitution for Adderall.  Please advise    [employer] [phone]

## 2021-06-02 ENCOUNTER — Telehealth: Payer: Self-pay | Admitting: Family Medicine

## 2021-06-02 NOTE — Telephone Encounter (Signed)
FYI

## 2021-06-02 NOTE — Telephone Encounter (Signed)
Pt Mother call and stated she want to thank dr.Fry for sending her daughter to dr.Ezzy he diagnose her with bipolar.

## 2021-06-24 ENCOUNTER — Other Ambulatory Visit: Payer: Self-pay | Admitting: Family Medicine

## 2021-07-19 DIAGNOSIS — L638 Other alopecia areata: Secondary | ICD-10-CM | POA: Diagnosis not present

## 2021-07-19 DIAGNOSIS — L648 Other androgenic alopecia: Secondary | ICD-10-CM | POA: Diagnosis not present

## 2021-07-23 ENCOUNTER — Emergency Department (HOSPITAL_COMMUNITY)
Admission: EM | Admit: 2021-07-23 | Discharge: 2021-07-24 | Disposition: A | Discharge status: Home / Self Care | Payer: 59 | Attending: Emergency Medicine | Admitting: Emergency Medicine

## 2021-07-23 DIAGNOSIS — T148XXA Other injury of unspecified body region, initial encounter: Secondary | ICD-10-CM

## 2021-07-23 DIAGNOSIS — R69 Illness, unspecified: Secondary | ICD-10-CM | POA: Diagnosis not present

## 2021-07-23 DIAGNOSIS — S61431A Puncture wound without foreign body of right hand, initial encounter: Secondary | ICD-10-CM | POA: Diagnosis not present

## 2021-07-23 DIAGNOSIS — Z23 Encounter for immunization: Secondary | ICD-10-CM | POA: Insufficient documentation

## 2021-07-23 DIAGNOSIS — S6991XA Unspecified injury of right wrist, hand and finger(s), initial encounter: Secondary | ICD-10-CM | POA: Diagnosis not present

## 2021-07-23 DIAGNOSIS — S61031A Puncture wound without foreign body of right thumb without damage to nail, initial encounter: Secondary | ICD-10-CM | POA: Insufficient documentation
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
## 2021-07-24 ENCOUNTER — Other Ambulatory Visit: Payer: Self-pay

## 2021-07-24 ENCOUNTER — Encounter (HOSPITAL_COMMUNITY): Payer: Self-pay | Admitting: Emergency Medicine

## 2021-07-24 MED ORDER — AMOXICILLIN-POT CLAVULANATE 875-125 MG PO TABS
1.0000 | ORAL_TABLET | Freq: Once | ORAL | Status: DC
  Filled 2021-07-24: qty 1

## 2021-07-24 MED ORDER — AMOXICILLIN-POT CLAVULANATE 875-125 MG PO TABS: 1.0000 | ORAL_TABLET | Freq: Two times a day (BID) | ORAL | 0 refills | Status: DC

## 2021-07-24 MED ORDER — TETANUS-DIPHTH-ACELL PERTUSSIS 5-2.5-18.5 LF-MCG/0.5 IM SUSY
0.5000 mL | PREFILLED_SYRINGE | Freq: Once | INTRAMUSCULAR | Status: AC
  Administered 2021-07-24: 0.5 mL via INTRAMUSCULAR
  Filled 2021-07-24: qty 0.5
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
## 2021-08-03 DIAGNOSIS — L648 Other androgenic alopecia: Secondary | ICD-10-CM | POA: Diagnosis not present

## 2021-08-03 DIAGNOSIS — L650 Telogen effluvium: Secondary | ICD-10-CM | POA: Diagnosis not present

## 2021-08-25 ENCOUNTER — Other Ambulatory Visit: Payer: Self-pay | Admitting: Family Medicine

## 2021-08-27 ENCOUNTER — Telehealth: Payer: Self-pay | Admitting: Family Medicine

## 2021-08-27 MED ORDER — ARIPIPRAZOLE 10 MG PO TABS: 10.0000 mg | ORAL_TABLET | Freq: Every day | ORAL | 5 refills | Status: DC

## 2021-08-27 MED ORDER — DIVALPROEX SODIUM 500 MG PO DR TAB: 1000.0000 mg | DELAYED_RELEASE_TABLET | Freq: Every day | ORAL | 5 refills | Status: AC

## 2021-08-27 NOTE — Telephone Encounter (Signed)
Called mom Truman Hayward lvm to call back to let Dr. Sarajane Jews know which medications.  ?

## 2021-08-27 NOTE — Telephone Encounter (Signed)
Pt mom is calling and would like dr fry to know her daughter will not go back to dr Altamese Stanley who is psychiartrist and will need refill on the medication dr izzy prescribed. I let mom know dr fry may not prescribed psych med. Pt does not need medication yet. Please advise ?

## 2021-08-27 NOTE — Telephone Encounter (Signed)
I sent these in  

## 2021-08-27 NOTE — Telephone Encounter (Signed)
The medications are aripiprazole 10 mg one tablet at bedtime and divalproex sod er 500 mg 2 tablets  by mouth at bedtime pt will need first medication in one wk and the other medication in about 2 wks  ?[id number] - [place], [place] - [address] AT [address] [facility] [address] Phone:  [phone]  ?Fax:  [phone]  ?  ? ?

## 2021-08-27 NOTE — Telephone Encounter (Signed)
Let me know what these medications are, and I might be able to write for them  ?

## 2021-08-30 NOTE — Telephone Encounter (Signed)
Pt mom is aware rx's has been sent ?

## 2021-09-21 ENCOUNTER — Other Ambulatory Visit: Payer: Self-pay | Admitting: Family Medicine

## 2021-09-24 ENCOUNTER — Other Ambulatory Visit: Payer: Self-pay | Admitting: Family Medicine

## 2021-10-09 ENCOUNTER — Other Ambulatory Visit: Payer: Self-pay | Admitting: Family Medicine

## 2021-10-31 ENCOUNTER — Other Ambulatory Visit: Payer: Self-pay | Admitting: Family Medicine

## 2021-11-09 ENCOUNTER — Telehealth: Payer: Self-pay | Admitting: Family Medicine

## 2021-11-09 NOTE — Telephone Encounter (Signed)
Pt's mother called to say she is willing to come to the clinic and personally pick Pt's records requested by Guardian dated 04/18/21 - present.   Please advise. [phone]

## 2021-11-10 NOTE — Telephone Encounter (Signed)
Pt has been scheduled for OV with Dr Clent Ridges per pt insurance

## 2021-11-15 ENCOUNTER — Encounter: Payer: Self-pay | Admitting: Family Medicine

## 2021-11-15 ENCOUNTER — Ambulatory Visit (INDEPENDENT_AMBULATORY_CARE_PROVIDER_SITE_OTHER): Payer: 59 | Admitting: Family Medicine

## 2021-11-15 VITALS — BP 132/80 | HR 63 | Temp 99.3°F | Wt 168.0 lb

## 2021-11-15 DIAGNOSIS — F319 Bipolar disorder, unspecified: Secondary | ICD-10-CM | POA: Insufficient documentation

## 2021-11-15 DIAGNOSIS — I10 Essential (primary) hypertension: Secondary | ICD-10-CM

## 2021-11-15 DIAGNOSIS — F17200 Nicotine dependence, unspecified, uncomplicated: Secondary | ICD-10-CM | POA: Diagnosis not present

## 2021-11-15 DIAGNOSIS — R69 Illness, unspecified: Secondary | ICD-10-CM | POA: Diagnosis not present

## 2021-11-15 DIAGNOSIS — F3161 Bipolar disorder, current episode mixed, mild: Secondary | ICD-10-CM

## 2021-11-15 NOTE — Progress Notes (Signed)
   Subjective:    Patient ID: [name], female    DOB: [birth year], 56 y.o.   MRN: [id number]  HPI Here with her mother for a 6 month check up. She saw Dr. Maggie Schwalbe for a psychiatric evaluation 3 times earlier this year, and he diagnosed her with bipolar disorder. She has done fairly well in her current regimen of Abilify and Depakote. Her moods are stable. Appetite and sleep are intact. Her BP is well controlled. She has quit smoking.    Review of Systems  Constitutional: Negative.   Respiratory: Negative.    Cardiovascular: Negative.   Gastrointestinal: Negative.   Genitourinary: Negative.   Neurological: Negative.        Objective:   Physical Exam Constitutional:      Appearance: Normal appearance.  Cardiovascular:     Rate and Rhythm: Normal rate and regular rhythm.     Pulses: Normal pulses.     Heart sounds: Normal heart sounds.  Pulmonary:     Effort: Pulmonary effort is normal.     Breath sounds: Normal breath sounds.  Musculoskeletal:     Right lower leg: No edema.     Left lower leg: No edema.  Neurological:     General: No focal deficit present.     Mental Status: She is alert and oriented to person, place, and time. Mental status is at baseline.  Psychiatric:        Mood and Affect: Mood normal.        Behavior: Behavior normal.           Assessment & Plan:  She seems to be doing well. Her bipolar disorder and HTN are stable. We will see her back in 6 months for her annual well exam.  Gershon Crane, MD

## 2021-11-29 ENCOUNTER — Other Ambulatory Visit: Payer: Self-pay | Admitting: Family Medicine

## 2021-12-21 ENCOUNTER — Other Ambulatory Visit: Payer: Self-pay | Admitting: Family Medicine

## 2022-01-03 ENCOUNTER — Other Ambulatory Visit: Payer: Self-pay | Admitting: Family Medicine

## 2022-01-03 DIAGNOSIS — Z1231 Encounter for screening mammogram for malignant neoplasm of breast: Secondary | ICD-10-CM

## 2022-01-24 ENCOUNTER — Other Ambulatory Visit: Payer: Self-pay | Admitting: Family Medicine

## 2022-02-02 ENCOUNTER — Ambulatory Visit (INDEPENDENT_AMBULATORY_CARE_PROVIDER_SITE_OTHER): Payer: 59 | Admitting: Family Medicine

## 2022-02-02 ENCOUNTER — Encounter: Payer: Self-pay | Admitting: Family Medicine

## 2022-02-02 VITALS — BP 118/78 | HR 78 | Temp 98.7°F | Wt 162.5 lb

## 2022-02-02 DIAGNOSIS — R413 Other amnesia: Secondary | ICD-10-CM | POA: Diagnosis not present

## 2022-02-02 DIAGNOSIS — F902 Attention-deficit hyperactivity disorder, combined type: Secondary | ICD-10-CM

## 2022-02-02 DIAGNOSIS — F3161 Bipolar disorder, current episode mixed, mild: Secondary | ICD-10-CM | POA: Diagnosis not present

## 2022-02-02 DIAGNOSIS — I10 Essential (primary) hypertension: Secondary | ICD-10-CM | POA: Diagnosis not present

## 2022-02-02 DIAGNOSIS — R69 Illness, unspecified: Secondary | ICD-10-CM | POA: Diagnosis not present

## 2022-02-02 MED ORDER — DONEPEZIL HCL 5 MG PO TABS: 5.0000 mg | ORAL_TABLET | Freq: Every day | ORAL | 0 refills | Status: DC

## 2022-02-02 NOTE — Progress Notes (Signed)
Subjective:    Patient ID: Lori Barrett, female    DOB: 11/30/1965, 56 y.o.   MRN: [id number]  HPI Here with her mother to follow up on bipolar disorder and ADHD. Her mother would like to address her memory problems. Her mother says this is getting worse and the patient denies this as usual. Her moods are stable. Her appetite ands sleep are intact. She has met with providers from Psychiatry, Neurology, and Neuropsychology for memory testing, but these encounters were not very helpful because the patient refused to cooperate with testing. Her mother has asked me if there is anything else we can do.    Review of Systems  Constitutional: Negative.   Respiratory: Negative.    Cardiovascular: Negative.   Psychiatric/Behavioral:  Positive for confusion and decreased concentration. Negative for agitation, behavioral problems, dysphoric mood and hallucinations. The patient is not nervous/anxious.        Objective:   Physical Exam Constitutional:      Appearance: Normal appearance.  Cardiovascular:     Rate and Rhythm: Normal rate and regular rhythm.     Pulses: Normal pulses.     Heart sounds: Normal heart sounds.  Pulmonary:     Effort: Pulmonary effort is normal.     Breath sounds: Normal breath sounds.  Neurological:     General: No focal deficit present.     Mental Status: She is alert and oriented to person, place, and time.  Psychiatric:        Mood and Affect: Mood normal.        Behavior: Behavior normal.        Thought Content: Thought content normal.           Assessment & Plan:  We talked about the fact that she has been tried on a number of medications to help her moods or her ADHD, but she has never tried anything specifically for memory. We agreed she will try Donepezil 5 mg daily for 30 days. We will meet again in 30 days to re-evaluate. I told them my goal at that time will be to increase the dose of this to 10 mg daily if all goes well.   , MD   

## 2022-02-03 ENCOUNTER — Ambulatory Visit: Payer: 59

## 2022-02-24 ENCOUNTER — Other Ambulatory Visit: Payer: Self-pay | Admitting: Family Medicine

## 2022-03-02 ENCOUNTER — Ambulatory Visit (INDEPENDENT_AMBULATORY_CARE_PROVIDER_SITE_OTHER): Payer: 59 | Admitting: Family Medicine

## 2022-03-02 ENCOUNTER — Encounter: Payer: Self-pay | Admitting: Family Medicine

## 2022-03-02 VITALS — BP 120/76 | HR 72 | Temp 98.5°F | Wt 166.2 lb

## 2022-03-02 DIAGNOSIS — M461 Sacroiliitis, not elsewhere classified: Secondary | ICD-10-CM

## 2022-03-02 DIAGNOSIS — R413 Other amnesia: Secondary | ICD-10-CM

## 2022-03-02 MED ORDER — DONEPEZIL HCL 10 MG PO TABS: 10.0000 mg | ORAL_TABLET | Freq: Every day | ORAL | 3 refills | Status: AC

## 2022-03-02 NOTE — Progress Notes (Signed)
   Subjective:    Patient ID: Lori Barrett, female    DOB: 1966/03/22, 56 y.o.   MRN: [id number]  HPI Here with her mother to follow up on memory loss and to discuss low back pain. One month ago she started taking Aricept 5 mg daily, and she has tolerated this well. Neither she or her mother have seen much change in her memory. Otherwise says she has a flare of low back pain several times a year. The pain is present on both sides of the lower back. It does not radiate to the legs. No hx of trauma. She usually does not treat it, and it goes away after a few days.    Review of Systems  Constitutional: Negative.   Respiratory: Negative.    Cardiovascular: Negative.   Musculoskeletal:  Positive for back pain.  Neurological:  Positive for weakness and numbness.  Psychiatric/Behavioral: Negative.         Objective:   Physical Exam Constitutional:      Appearance: Normal appearance.  Cardiovascular:     Rate and Rhythm: Normal rate and regular rhythm.     Pulses: Normal pulses.     Heart sounds: Normal heart sounds.  Pulmonary:     Effort: Pulmonary effort is normal.     Breath sounds: Normal breath sounds.  Musculoskeletal:     Comments: She is mildly tender on both sides of the lower back, and not in the midline. ROM is full   Neurological:     Mental Status: She is alert.           Assessment & Plan:  For the memory loss, we will increase the Aricept to 10 mg daily as anticipated. She seems to have some sacroiliitis, and she can take Ibuprofen as needed.  Gershon Crane, MD

## 2022-03-12 DIAGNOSIS — R0602 Shortness of breath: Secondary | ICD-10-CM | POA: Diagnosis not present

## 2022-03-12 DIAGNOSIS — F418 Other specified anxiety disorders: Secondary | ICD-10-CM | POA: Diagnosis not present

## 2022-03-12 DIAGNOSIS — Z91041 Radiographic dye allergy status: Secondary | ICD-10-CM | POA: Diagnosis not present

## 2022-03-12 DIAGNOSIS — Z1152 Encounter for screening for COVID-19: Secondary | ICD-10-CM | POA: Diagnosis not present

## 2022-03-12 DIAGNOSIS — R7303 Prediabetes: Secondary | ICD-10-CM | POA: Diagnosis not present

## 2022-03-12 DIAGNOSIS — F1010 Alcohol abuse, uncomplicated: Secondary | ICD-10-CM | POA: Diagnosis not present

## 2022-03-12 DIAGNOSIS — F17210 Nicotine dependence, cigarettes, uncomplicated: Secondary | ICD-10-CM | POA: Diagnosis not present

## 2022-03-12 DIAGNOSIS — J101 Influenza due to other identified influenza virus with other respiratory manifestations: Secondary | ICD-10-CM | POA: Diagnosis not present

## 2022-03-12 DIAGNOSIS — J441 Chronic obstructive pulmonary disease with (acute) exacerbation: Secondary | ICD-10-CM | POA: Diagnosis not present

## 2022-03-12 DIAGNOSIS — J9601 Acute respiratory failure with hypoxia: Secondary | ICD-10-CM | POA: Diagnosis not present

## 2022-03-12 DIAGNOSIS — D860 Sarcoidosis of lung: Secondary | ICD-10-CM | POA: Diagnosis not present

## 2022-03-12 DIAGNOSIS — Z885 Allergy status to narcotic agent status: Secondary | ICD-10-CM | POA: Diagnosis not present

## 2022-03-12 DIAGNOSIS — Z79899 Other long term (current) drug therapy: Secondary | ICD-10-CM | POA: Diagnosis not present

## 2022-03-12 DIAGNOSIS — Z794 Long term (current) use of insulin: Secondary | ICD-10-CM | POA: Diagnosis not present

## 2022-03-12 DIAGNOSIS — Z28310 Unvaccinated for covid-19: Secondary | ICD-10-CM | POA: Diagnosis not present

## 2022-03-12 DIAGNOSIS — I5030 Unspecified diastolic (congestive) heart failure: Secondary | ICD-10-CM | POA: Diagnosis not present

## 2022-03-12 DIAGNOSIS — J440 Chronic obstructive pulmonary disease with acute lower respiratory infection: Secondary | ICD-10-CM | POA: Diagnosis not present

## 2022-03-24 ENCOUNTER — Ambulatory Visit
Admission: RE | Admit: 2022-03-24 | Discharge: 2022-03-24 | Disposition: A | Discharge status: Home / Self Care | Payer: 59 | Source: Ambulatory Visit | Attending: Family Medicine | Admitting: Family Medicine

## 2022-03-24 DIAGNOSIS — Z1231 Encounter for screening mammogram for malignant neoplasm of breast: Secondary | ICD-10-CM | POA: Diagnosis not present

## 2022-05-18 ENCOUNTER — Telehealth: Payer: Self-pay | Admitting: Family Medicine

## 2022-05-18 DIAGNOSIS — Z87891 Personal history of nicotine dependence: Secondary | ICD-10-CM | POA: Diagnosis not present

## 2022-05-18 DIAGNOSIS — F0390 Unspecified dementia without behavioral disturbance: Secondary | ICD-10-CM | POA: Diagnosis not present

## 2022-05-18 DIAGNOSIS — R69 Illness, unspecified: Secondary | ICD-10-CM | POA: Diagnosis not present

## 2022-05-18 DIAGNOSIS — I10 Essential (primary) hypertension: Secondary | ICD-10-CM | POA: Diagnosis not present

## 2022-05-18 DIAGNOSIS — F209 Schizophrenia, unspecified: Secondary | ICD-10-CM | POA: Diagnosis not present

## 2022-05-18 DIAGNOSIS — Z8249 Family history of ischemic heart disease and other diseases of the circulatory system: Secondary | ICD-10-CM | POA: Diagnosis not present

## 2022-05-18 DIAGNOSIS — Z91013 Allergy to seafood: Secondary | ICD-10-CM | POA: Diagnosis not present

## 2022-05-18 NOTE — Telephone Encounter (Signed)
Spoke with  Magda with [facility] due to HIPAA I could not provide any information regarding pt, advised if need pt information to follow the process of signing a Records Release form and fax it to the office, verbalized understanding

## 2022-05-18 NOTE — Telephone Encounter (Signed)
Asking for someone from the care team to call to discuss patient's medications. Patient seemed confused with listing meds and reason for taking. Magda has to close the chart by midnight tonight and asks if you could please call today.

## 2022-07-17 IMAGING — MG MM DIGITAL SCREENING BILAT W/ TOMO AND CAD
8 series · 9 of 24 positions shown · non-contrast
Comparison: None.

CLINICAL DATA: Screening.

EXAM:
DIGITAL SCREENING BILATERAL MAMMOGRAM WITH TOMOSYNTHESIS AND CAD
TECHNIQUE: Bilateral screening digital craniocaudal and mediolateral oblique
mammograms were obtained. Bilateral screening digital breast
tomosynthesis was performed. The images were evaluated with
computer-aided detection.

[R MLO synth-2D]
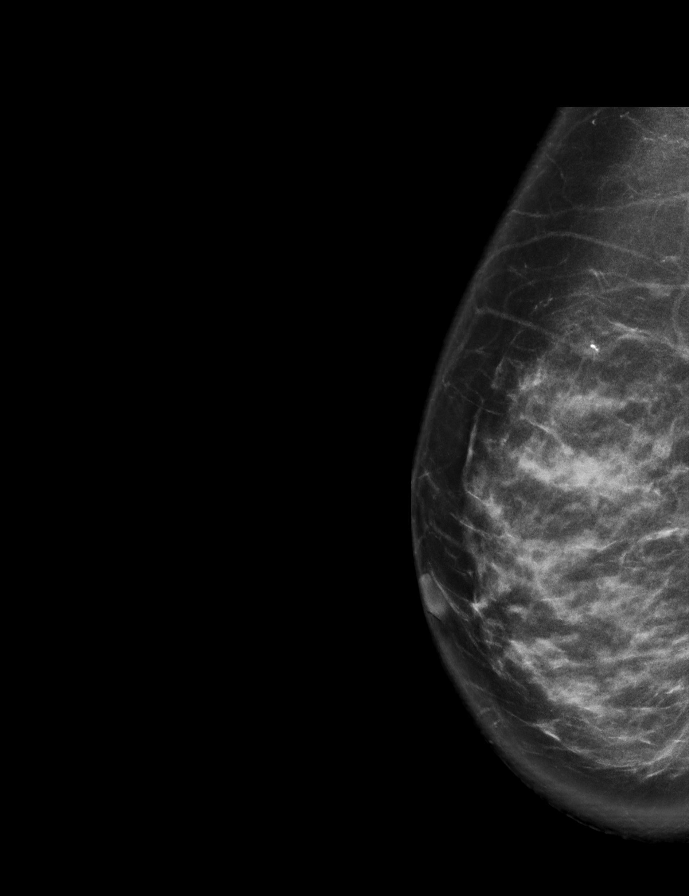

[L CC synth-2D]
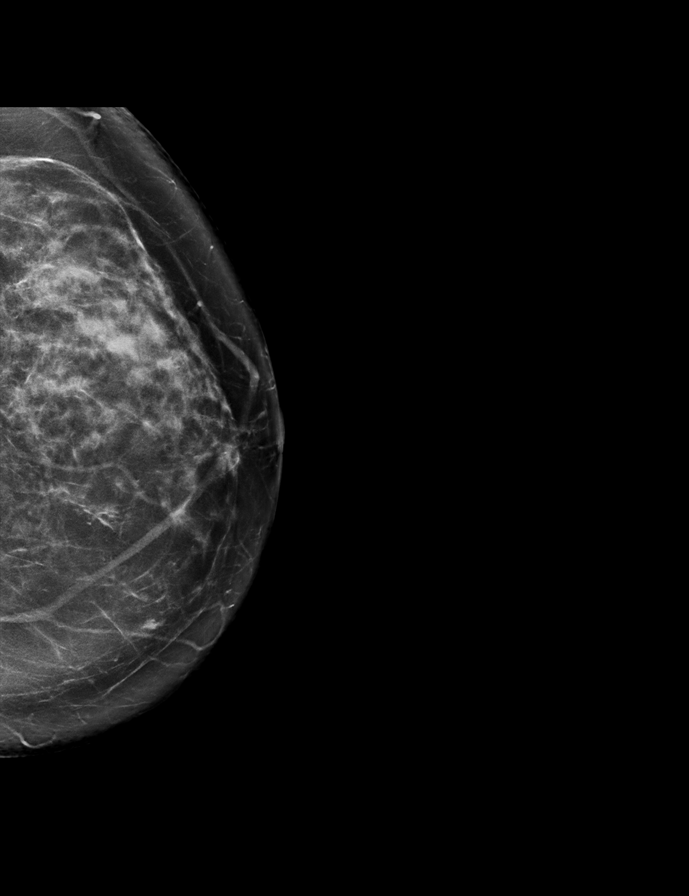

[L MLO synth-2D]
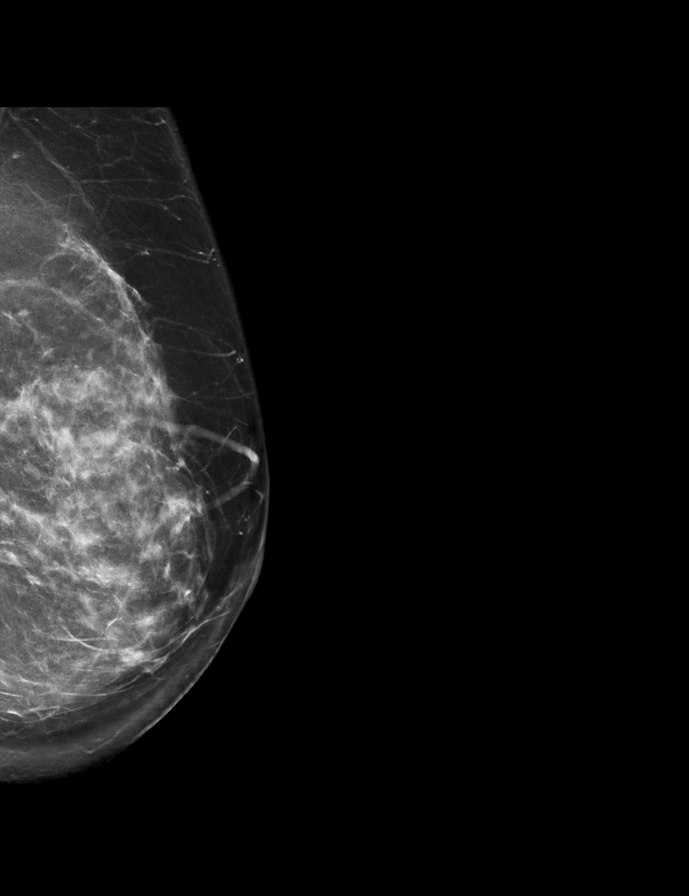

[R CC synth-2D]
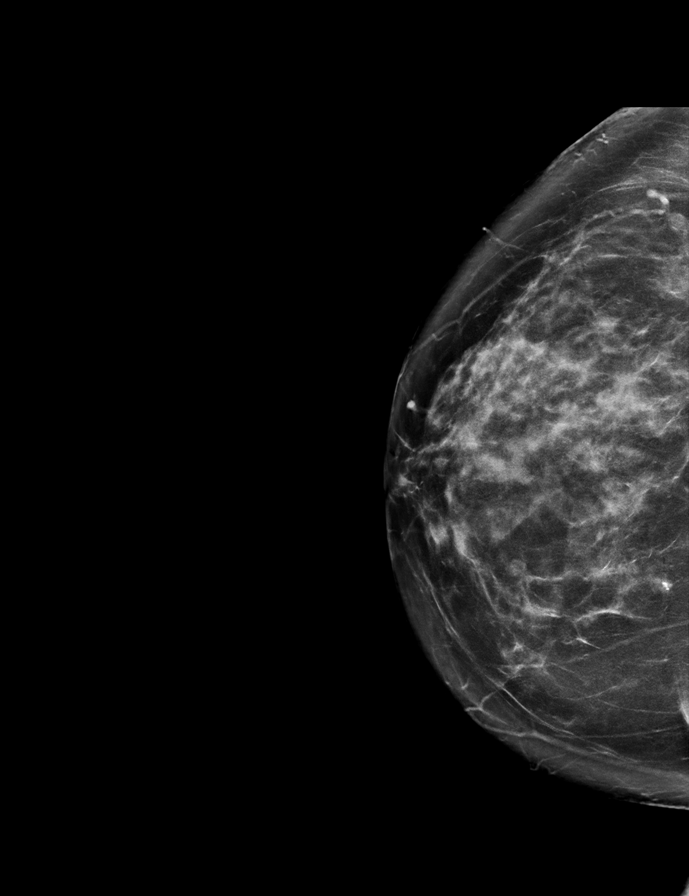

[L CC tomo · 2 of 80 frames shown]
[frame 26/80]
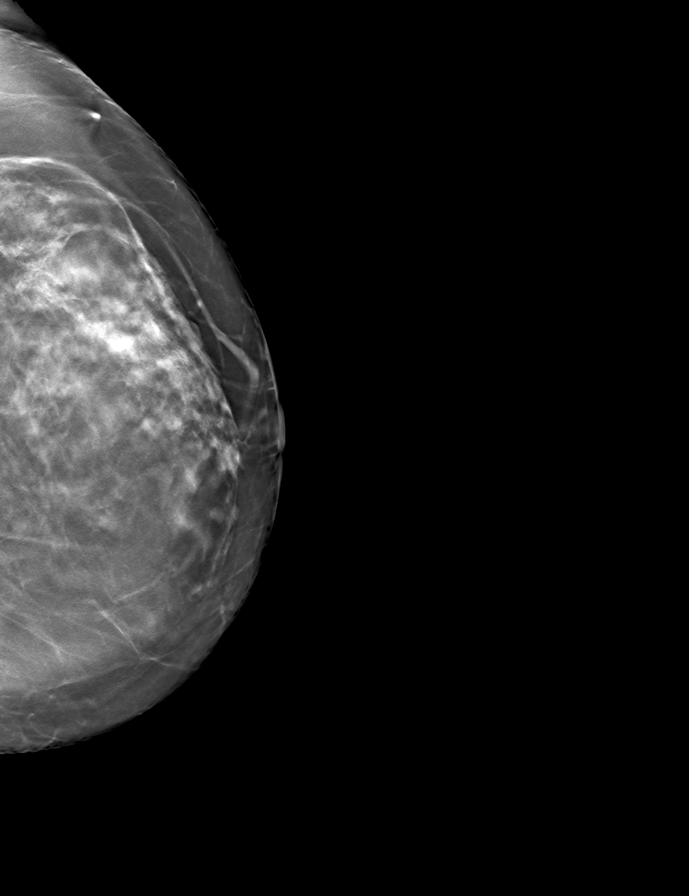
[frame 41/80]
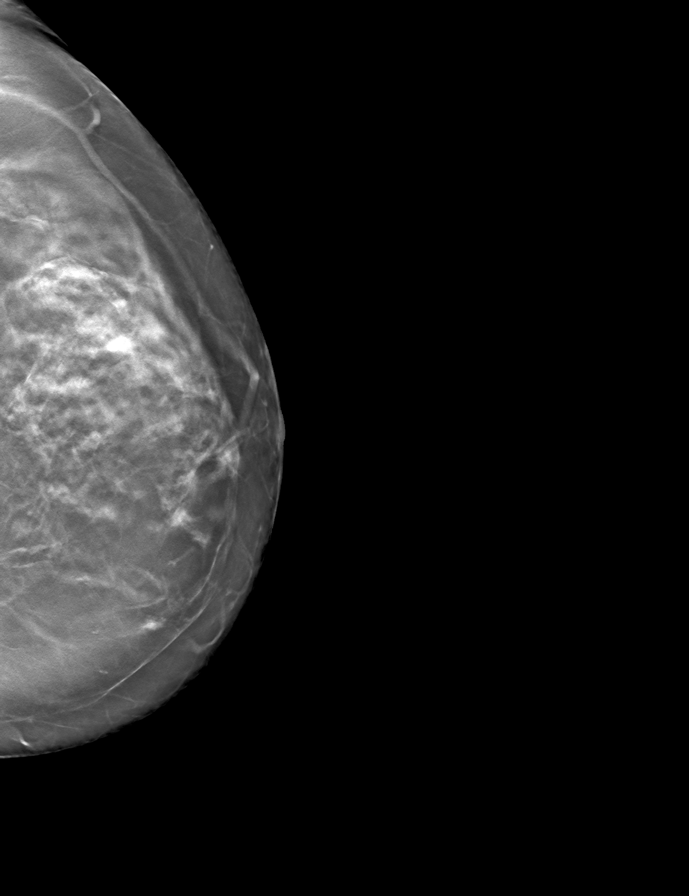

[R MLO tomo · tomo slice 41/81.0]
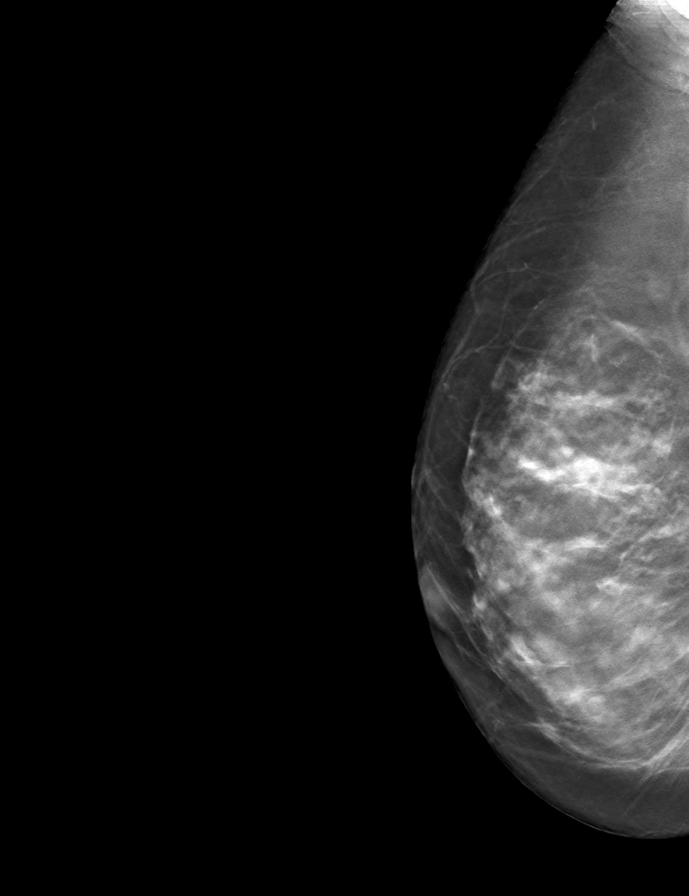

[L MLO tomo · tomo slice 43/84.0]
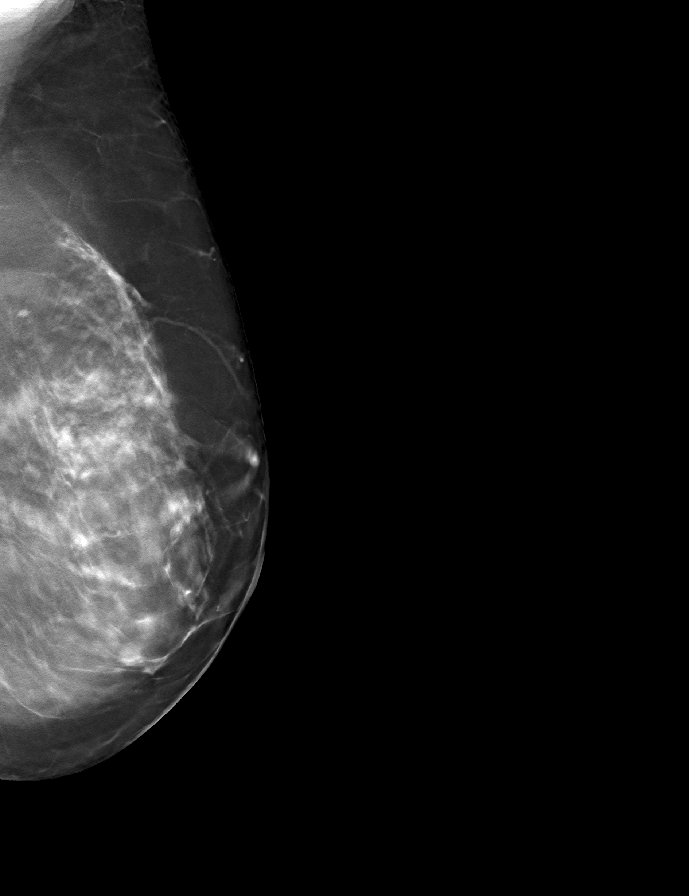

[R CC tomo · tomo slice 41/81.0]
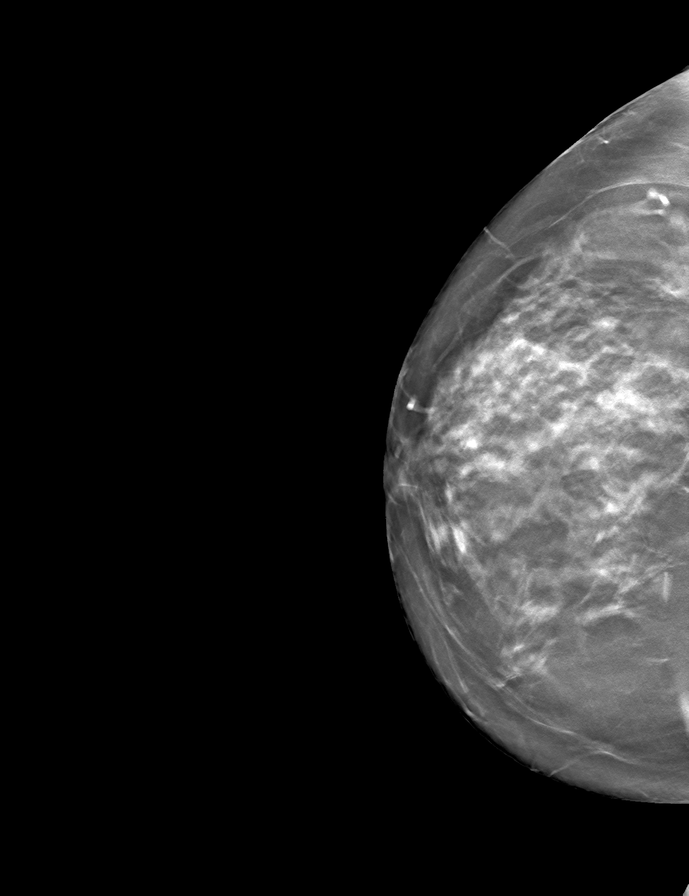

[9 of 24 positions shown; findings below may reference images not displayed]

ACR Breast Density Category c: The breast tissue is heterogeneously
dense, which may obscure small masses.
FINDINGS: In the left breast, a possible asymmetry warrants further
evaluation. In the right breast, no findings suspicious for
malignancy.
IMPRESSION: Further evaluation is suggested for possible asymmetry in the left
breast.

RECOMMENDATION:
Diagnostic mammogram and possibly ultrasound of the left breast.
(Code:M5-1-550)

The patient will be contacted regarding the findings, and additional
imaging will be scheduled.

BI-RADS CATEGORY  0: Incomplete. Need additional imaging evaluation
and/or prior mammograms for comparison.

## 2022-09-13 ENCOUNTER — Emergency Department (HOSPITAL_COMMUNITY)
Admission: EM | Admit: 2022-09-13 | Discharge: 2022-09-14 | Disposition: A | Discharge status: Home / Self Care | Payer: 59 | Attending: Emergency Medicine | Admitting: Emergency Medicine

## 2022-09-13 ENCOUNTER — Encounter (HOSPITAL_COMMUNITY): Payer: Self-pay

## 2022-09-13 ENCOUNTER — Other Ambulatory Visit: Payer: Self-pay

## 2022-09-13 DIAGNOSIS — Z87891 Personal history of nicotine dependence: Secondary | ICD-10-CM | POA: Insufficient documentation

## 2022-09-13 DIAGNOSIS — F10129 Alcohol abuse with intoxication, unspecified: Secondary | ICD-10-CM | POA: Diagnosis not present

## 2022-09-13 DIAGNOSIS — Z638 Other specified problems related to primary support group: Secondary | ICD-10-CM | POA: Diagnosis not present

## 2022-09-13 DIAGNOSIS — R4689 Other symptoms and signs involving appearance and behavior: Secondary | ICD-10-CM

## 2022-09-13 DIAGNOSIS — I10 Essential (primary) hypertension: Secondary | ICD-10-CM | POA: Insufficient documentation

## 2022-09-13 DIAGNOSIS — F10120 Alcohol abuse with intoxication, uncomplicated: Secondary | ICD-10-CM | POA: Diagnosis not present

## 2022-09-13 DIAGNOSIS — R456 Violent behavior: Secondary | ICD-10-CM | POA: Insufficient documentation

## 2022-09-13 DIAGNOSIS — F10920 Alcohol use, unspecified with intoxication, uncomplicated: Secondary | ICD-10-CM

## 2022-09-13 LAB — COMPREHENSIVE METABOLIC PANEL
ALT: 28 U/L (ref 0–44)
AST: 21 U/L (ref 15–41)
Albumin: 4.2 g/dL (ref 3.5–5.0)
Alkaline Phosphatase: 58 U/L (ref 38–126)
Anion gap: 11 (ref 5–15)
BUN: 9 mg/dL (ref 6–20)
CO2: 23 mmol/L (ref 22–32)
Calcium: 8.5 mg/dL — ABNORMAL LOW (ref 8.9–10.3)
Chloride: 105 mmol/L (ref 98–111)
Creatinine, Ser: 0.64 mg/dL (ref 0.44–1.00)
GFR, Estimated: >60 mL/min (ref >60)
Glucose, Bld: 139 mg/dL — ABNORMAL HIGH (ref 70–99)
Potassium: 3.5 mmol/L (ref 3.5–5.1)
Sodium: 139 mmol/L (ref 135–145)
Total Bilirubin: 0.2 mg/dL — ABNORMAL LOW (ref 0.3–1.2)
Total Protein: 7.7 g/dL (ref 6.5–8.1)

## 2022-09-13 LAB — CBC WITH DIFFERENTIAL/PLATELET
Abs Immature Granulocytes: 0.03 10*3/uL (ref 0.00–0.07)
Basophils Absolute: 0 10*3/uL (ref 0.0–0.1)
Basophils Relative: 0 %
Eosinophils Absolute: 0.1 10*3/uL (ref 0.0–0.5)
Eosinophils Relative: 1 %
HCT: 40.5 % (ref 36.0–46.0)
Hemoglobin: 13.4 g/dL (ref 12.0–15.0)
Immature Granulocytes: 0 %
Lymphocytes Relative: 31 %
Lymphs Abs: 2.5 10*3/uL (ref 0.7–4.0)
MCH: 29.8 pg (ref 26.0–34.0)
MCHC: 33.1 g/dL (ref 30.0–36.0)
MCV: 90.2 fL (ref 80.0–100.0)
Monocytes Absolute: 0.6 10*3/uL (ref 0.1–1.0)
Monocytes Relative: 7 %
Neutro Abs: 4.9 10*3/uL (ref 1.7–7.7)
Neutrophils Relative %: 61 %
Platelets: 414 10*3/uL — ABNORMAL HIGH (ref 150–400)
RBC: 4.49 MIL/uL (ref 3.87–5.11)
RDW: 12.7 % (ref 11.5–15.5)
WBC: 8.1 10*3/uL (ref 4.0–10.5)
nRBC: 0 % (ref 0.0–0.2)

## 2022-09-13 LAB — VALPROIC ACID LEVEL: Valproic Acid Lvl: <10 ug/mL — ABNORMAL LOW (ref 50.0–100.0)

## 2022-09-13 LAB — ETHANOL: Alcohol, Ethyl (B): 134 mg/dL — ABNORMAL HIGH (ref <10)

## 2022-09-13 LAB — SALICYLATE LEVEL: Salicylate Lvl: <7 mg/dL — ABNORMAL LOW (ref 7.0–30.0)

## 2022-09-13 LAB — ACETAMINOPHEN LEVEL: Acetaminophen (Tylenol), Serum: <10 ug/mL — ABNORMAL LOW (ref 10–30)

## 2022-09-13 MED ORDER — DIAZEPAM 5 MG/ML IJ SOLN
5.0000 mg | Freq: Once | INTRAMUSCULAR | Status: AC
  Administered 2022-09-13: 5 mg via INTRAMUSCULAR
  Filled 2022-09-13: qty 2

## 2022-09-13 MED ORDER — ARIPIPRAZOLE 10 MG PO TABS
10.0000 mg | ORAL_TABLET | Freq: Every day | ORAL | Status: DC
  Administered 2022-09-14: 10 mg via ORAL
  Filled 2022-09-13: qty 1

## 2022-09-13 MED ORDER — AMLODIPINE BESYLATE 5 MG PO TABS
10.0000 mg | ORAL_TABLET | Freq: Every day | ORAL | Status: DC
  Administered 2022-09-14: 10 mg via ORAL
  Filled 2022-09-13: qty 2

## 2022-09-13 MED ORDER — ZIPRASIDONE MESYLATE 20 MG IM SOLR
20.0000 mg | Freq: Once | INTRAMUSCULAR | Status: AC
  Administered 2022-09-13: 20 mg via INTRAMUSCULAR
  Filled 2022-09-13: qty 20

## 2022-09-13 MED ORDER — STERILE WATER FOR INJECTION IJ SOLN
INTRAMUSCULAR | Status: AC
  Administered 2022-09-13: 10 mL
  Filled 2022-09-13: qty 10

## 2022-09-13 MED ORDER — DIPHENHYDRAMINE HCL 50 MG/ML IJ SOLN
50.0000 mg | Freq: Once | INTRAMUSCULAR | Status: AC
  Administered 2022-09-13: 50 mg via INTRAMUSCULAR
  Filled 2022-09-13: qty 1

## 2022-09-13 MED ORDER — LOSARTAN POTASSIUM-HCTZ 50-12.5 MG PO TABS: 1.0000 | ORAL_TABLET | Freq: Every day | ORAL | Status: DC

## 2022-09-13 MED ORDER — DIVALPROEX SODIUM 500 MG PO DR TAB
1000.0000 mg | DELAYED_RELEASE_TABLET | Freq: Every day | ORAL | Status: DC
  Administered 2022-09-13: 1000 mg via ORAL
  Filled 2022-09-13 (×2): qty 2

## 2022-09-13 MED ORDER — DONEPEZIL HCL 5 MG PO TABS
10.0000 mg | ORAL_TABLET | Freq: Every day | ORAL | Status: DC
  Administered 2022-09-13: 10 mg via ORAL
  Filled 2022-09-13: qty 2

## 2022-09-13 MED ORDER — HALOPERIDOL LACTATE 5 MG/ML IJ SOLN
5.0000 mg | Freq: Once | INTRAMUSCULAR | Status: AC
  Administered 2022-09-13: 5 mg via INTRAMUSCULAR
  Filled 2022-09-13: qty 1

## 2022-09-13 NOTE — ED Notes (Signed)
Patient dressed out into burgundy scrub top. Patient still refusing to dress into scrub pants
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
## 2022-09-13 NOTE — ED Notes (Signed)
Lori Barrett attempted to palm part of her medicine during medication pass and had to be watched closely for cheeking her medication.

## 2022-09-13 NOTE — ED Notes (Signed)
ED Provider at bedside. 

## 2022-09-13 NOTE — ED Notes (Signed)
Medicated for restless agitation, attempted elopement, and not following staff redirection. Order for geodon 20 mg IM obtained and Lori Barrett voluntarily allowed the medication to be administered IM.
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
## 2022-09-13 NOTE — ED Notes (Signed)
Patient refusing EKG and blood draw

## 2022-09-13 NOTE — ED Notes (Signed)
Patient refusing to stay in room or dress out into scrubs at this time
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
## 2022-09-14 DIAGNOSIS — Z638 Other specified problems related to primary support group: Secondary | ICD-10-CM

## 2022-09-14 LAB — RAPID URINE DRUG SCREEN, HOSP PERFORMED
Amphetamines: NOT DETECTED
Barbiturates: NOT DETECTED
Benzodiazepines: NOT DETECTED
Cocaine: NOT DETECTED
Opiates: NOT DETECTED
Tetrahydrocannabinol: NOT DETECTED

## 2022-09-14 MED ORDER — LOSARTAN POTASSIUM 50 MG PO TABS
50.0000 mg | ORAL_TABLET | Freq: Every day | ORAL | Status: DC
  Administered 2022-09-14: 50 mg via ORAL
  Filled 2022-09-14: qty 1

## 2022-09-14 MED ORDER — HYDROCHLOROTHIAZIDE 12.5 MG PO TABS
12.5000 mg | ORAL_TABLET | Freq: Every day | ORAL | Status: DC
  Administered 2022-09-14: 12.5 mg via ORAL
  Filled 2022-09-14: qty 1

## 2022-09-14 NOTE — ED Notes (Signed)
Care Management  The writer reached out to the patient's mother, [name], to obtain collateral on the patient's behaviors and why she was IVC'd. Ms. Okey Dupre stated that she "could not remember", however she said that her daughter has a history of aggressive behavior and has not been taking her medications. She stated that the patient has not been physically aggressive to her. Ms. Okey Dupre inquired to when the patient would be able to be discharged and plans to come to the hospital presently.

## 2022-09-14 NOTE — ED Notes (Signed)
Pt belongings were searched for and not found in all pt belongings cabinets and all lockers and triage area cabinets.  They are missing or not labeled.  No description could be given other that the shoes were new Nike dark blue shoes and jeans and top.  No other descriptions given. When I tried to ask, I was told " I don't know " by patient.  Pt mother is very upset and I gave her the phone number to Office of Patient Experience.  Pt left in Blue scrubs and socks.

## 2022-09-14 NOTE — Discharge Instructions (Addendum)
You were seen in the emergency department for your aggression.  He should make sure that you are taking all of your regular home medications as prescribed.  You should also try to cut back on your alcohol use.  You can follow-up with psychiatry as an outpatient and you can go to the behavioral health urgent care if you need more urgent psychiatry needs.  You can return to the emergency department if you are having alcohol withdrawal or withdrawal seizures, you had thoughts of wanting to hurt yourself or others or if you have any other new or concerning symptoms.

## 2022-09-14 NOTE — ED Notes (Addendum)
3rd attempt at collecting urine specimen Lori Barrett continues to dump urine out of speci-hat prior to staff collecting specimen. The plan is that staff will go in BR with her to obtain specimen.

## 2022-09-14 NOTE — ED Provider Notes (Addendum)
Emergency Medicine Observation Re-evaluation Note  Lori Barrett is a 57 y.o. female, seen on rounds today.  Pt initially presented to the ED for complaints of Aggressive Behavior Currently, the patient is awake and alert getting ready for the day.  Physical Exam  BP (!) 160/86 (BP Location: Right Arm)   Pulse 86   Temp 98.4 F (36.9 C) (Oral)   Resp 16   Ht 5\' 3"  (1.6 m)   Wt 75.4 kg   LMP 01/14/2011   SpO2 97%   BMI 29.45 kg/m  Physical Exam General: Awake and alert, no acute distress Cardiac: Regular rate Lungs: No increased work of breathing Psych: Calm, cooperative  ED Course / MDM  EKG:EKG Interpretation  Date/Time:  Tuesday Sep 13 2022 19:16:49 EDT Ventricular Rate:  92 PR Interval:  148 QRS Duration: 74 QT Interval:  354 QTC Calculation: 437 R Axis:   56 Text Interpretation: Normal sinus rhythm ST & T wave abnormality, consider inferior ischemia Abnormal ECG When compared with ECG of 30-Mar-2020 21:40, PREVIOUS ECG IS PRESENT Confirmed by Richardean Canal [phone]) on 09/13/2022 7:19:58 PM  I have reviewed the labs performed to date as well as medications administered while in observation.  Recent changes in the last 24 hours include agitated overnight the night requiring IM Geodon.  Patient was medically cleared and is pending psych eval for disposition.  Plan  Current plan is for psych eval for disposition.  Clinical Course as of 09/14/22 1218  Wed Sep 14, 2022  1215 Patient evaluated by Joaquin Courts NP with psych who states she is psych cleared for discharge with outpatient follow up. IVC will be rescinded.  [VK]    Clinical Course User Index [VK] Rexford Maus, DO       Rexford Maus, [place] 09/14/22 1218

## 2022-09-14 NOTE — Discharge Summary (Signed)
[facility] Psych ED Discharge  09/14/2022 12:03 PM KU. 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   Depression with anxiety 05/05/2006   HTN (hypertension) 10/09/2012   Sinusitis    Tobacco use     Past Surgical History:  Procedure Laterality Date   COLONOSCOPY     declines to ever have one   Family History:  Family History  Problem Relation Age of Onset   Liver disease Other    Alzheimer's disease Maternal Grandmother    Family Psychiatric  History: *** Social History:  Social History   Substance and Sexual Activity  Alcohol Use Yes   Alcohol/week: 28.0 standard drinks of alcohol   Types: 28 Standard drinks or equivalent per week   Comment: 4-5 "small" beers per night     Social History   Substance and Sexual Activity  Drug Use No    Social History   Socioeconomic History   Marital status: Single    Spouse name: Not on file   Number of children: Not on file   Years of education: 14   Highest education level: Associate degree: academic program  Occupational History   Not on file  Tobacco Use   Smoking status: Every Day    Types: Cigarettes   Smokeless tobacco: Never   Tobacco comments:    1/2 ppd  Substance and Sexual Activity   Alcohol use: Yes    Alcohol/week: 28.0 standard drinks of alcohol    Types: 28 Standard drinks or equivalent per week    Comment: 4-5 "small" beers per night   Drug use: No   Sexual activity: Not on file  Other Topics Concern   Not on  file  Social History Narrative   Not on file   Social Determinants of Health   Financial Resource Strain: Not on file  Food Insecurity: Not on file  Transportation Needs: Not on file  Physical Activity: Not on file  Stress: Not on file  Social Connections: Not on file    Tobacco Cessation:  {Discharge tobacco cessation prescription:304700209}  Current Medications: Current Facility-Administered Medications  Medication Dose Route Frequency Provider Last Rate Last Admin   amLODipine (NORVASC) tablet 10 mg  10 mg Oral Daily Charlynne Pander, MD   10 mg at 09/14/22 1054   ARIPiprazole (ABILIFY) tablet 10 mg  10 mg Oral Daily Charlynne Pander, MD   10 mg at 09/14/22 1045   divalproex (DEPAKOTE) DR tablet 1,000 mg  1,000 mg Oral QHS Charlynne Pander, MD   1,000 mg at 09/13/22 2239   donepezil (ARICEPT) tablet 10 mg  10 mg Oral QHS Charlynne Pander, MD   10 mg at 09/13/22 2239   losartan (COZAAR) tablet 50 mg  50 mg Oral Daily Pricilla Riffle, RPH   50 mg at 09/14/22 1054   And   hydrochlorothiazide (HYDRODIURIL) tablet 12.5 mg  12.5 mg Oral Daily Pricilla Riffle, RPH   12.5 mg at 09/14/22 1054   Current Outpatient Medications  Medication Sig Dispense Refill   donepezil (ARICEPT)  10 MG tablet Take 1 tablet (10 mg total) by mouth at bedtime. 90 tablet 3   risperiDONE (RISPERDAL) 1 MG tablet Take 1 mg by mouth daily.     amLODipine (NORVASC) 10 MG tablet Take 1 tablet (10 mg total) by mouth daily. (Patient not taking: Reported on 09/14/2022) 90 tablet 3   ARIPiprazole (ABILIFY) 10 MG tablet TAKE 1 TABLET BY MOUTH EVERYDAY AT BEDTIME (Patient not taking: Reported on 09/14/2022) 90 tablet 1   divalproex (DEPAKOTE) 500 MG DR tablet Take 2 tablets (1,000 mg total) by mouth at bedtime. (Patient not taking: Reported on 09/14/2022) 60 tablet 5   losartan-hydrochlorothiazide (HYZAAR) 50-12.5 MG tablet TAKE 1 TABLET BY MOUTH EVERY DAY (Patient not taking: Reported on 09/14/2022) 90 tablet 3    Multiple Vitamins-Minerals ([employer] SPECTRAVITE ADULTS) TABS TAKE 1 TABLET BY MOUTH EVERY DAY (Patient not taking: Reported on 09/14/2022) 90 tablet 3   PTA Medications: (Not in a hospital admission)   [place] Scale:  Flowsheet Row ED from 09/13/2022 in [facility] Emergency Department at [facility] ED from 07/23/2021 in [facility] Emergency Department at [facility]  C-SSRS RISK CATEGORY No Risk No Risk       Musculoskeletal: Strength & Muscle Tone: {desc; muscle tone:32375} Gait & Station: {PE GAIT ED QMVH:84696} Patient leans: {Patient Leans:21022755}  Psychiatric Specialty Exam: Presentation  General Appearance:  Fairly Groomed  Eye Contact: Fair  Speech: Clear and Coherent  Speech Volume: Normal  Handedness: Right   Mood and Affect  Mood: Euthymic  Affect: Appropriate   Thought Process  Thought Processes: Coherent  Descriptions of Associations:Intact  Orientation:Full (Time, Place and Person)  Thought Content:WDL  History of Schizophrenia/Schizoaffective disorder:No data recorded Duration of Psychotic Symptoms:No data recorded Hallucinations:Hallucinations: None  Ideas of Reference:None  Suicidal Thoughts:Suicidal Thoughts: No  Homicidal Thoughts:Homicidal Thoughts: No   Sensorium  Memory: Immediate Good; Recent Good; Remote Good  Judgment: Fair  Insight: Poor   Executive Functions  Concentration: Fair  Attention Span: Fair  Recall: Fair  Fund of Knowledge: Fair  Language: Fair   Psychomotor Activity  Psychomotor Activity: Psychomotor Activity: Other (comment)   Assets  Assets: Communication Skills; Desire for Improvement; Physical Health; Social Support   Sleep  Sleep: Sleep: Good Number of Hours of Sleep: 6    Physical Exam: Physical Exam ROS Blood pressure (!) 160/86, pulse 86, temperature 98.4 F (36.9 C), temperature source Oral, resp. rate 16, height 5\' 3"  (1.6 m), weight 75.4 kg,  last menstrual period 01/14/2011, SpO2 97 %. Body mass index is 29.45 kg/m.   Demographic Factors:  {Demographic Factors:20662}  Loss Factors: {Loss Factors:20659}  Historical Factors: {Historical Factors:20660}  Risk Reduction Factors:   {Risk Reduction Factors:20661}  Continued Clinical Symptoms:  {Clinical Factors:22706}  Cognitive Features That Contribute To Risk:  {chl bhh Cognitive Features:304700251}    Suicide Risk:  {BHH SUICIDE RISK:22704}    Plan Of Care/Follow-up recommendations:  {BHH DC FU RECOMMENDATIONS:22620}  Medical Decision Making: ***  Problem 1: ***  Problem 2: ***  Problem 3: ***  Disposition: Data Unavailable  Joaquin Courts, NP 09/14/2022, 12:03 PM

## 2022-09-15 DIAGNOSIS — R4689 Other symptoms and signs involving appearance and behavior: Secondary | ICD-10-CM

## 2022-09-15 DIAGNOSIS — F10920 Alcohol use, unspecified with intoxication, uncomplicated: Secondary | ICD-10-CM

## 2022-09-16 ENCOUNTER — Encounter (HOSPITAL_COMMUNITY): Payer: Self-pay

## 2022-09-16 ENCOUNTER — Emergency Department (HOSPITAL_COMMUNITY): Payer: 59

## 2022-09-16 ENCOUNTER — Emergency Department (HOSPITAL_COMMUNITY)
Admission: EM | Admit: 2022-09-16 | Discharge: 2022-09-16 | Disposition: A | Discharge status: Home / Self Care | Payer: 59 | Attending: Emergency Medicine | Admitting: Emergency Medicine

## 2022-09-16 ENCOUNTER — Other Ambulatory Visit: Payer: Self-pay

## 2022-09-16 DIAGNOSIS — R4182 Altered mental status, unspecified: Secondary | ICD-10-CM | POA: Diagnosis not present

## 2022-09-16 DIAGNOSIS — F03911 Unspecified dementia, unspecified severity, with agitation: Secondary | ICD-10-CM | POA: Insufficient documentation

## 2022-09-16 DIAGNOSIS — Y908 Blood alcohol level of 240 mg/100 ml or more: Secondary | ICD-10-CM | POA: Diagnosis not present

## 2022-09-16 DIAGNOSIS — I6381 Other cerebral infarction due to occlusion or stenosis of small artery: Secondary | ICD-10-CM | POA: Diagnosis not present

## 2022-09-16 DIAGNOSIS — R456 Violent behavior: Secondary | ICD-10-CM | POA: Diagnosis not present

## 2022-09-16 DIAGNOSIS — F10129 Alcohol abuse with intoxication, unspecified: Secondary | ICD-10-CM | POA: Diagnosis not present

## 2022-09-16 DIAGNOSIS — F29 Unspecified psychosis not due to a substance or known physiological condition: Secondary | ICD-10-CM | POA: Diagnosis not present

## 2022-09-16 DIAGNOSIS — R0902 Hypoxemia: Secondary | ICD-10-CM | POA: Diagnosis not present

## 2022-09-16 DIAGNOSIS — F10920 Alcohol use, unspecified with intoxication, uncomplicated: Secondary | ICD-10-CM | POA: Diagnosis not present

## 2022-09-16 DIAGNOSIS — R400 Somnolence: Secondary | ICD-10-CM | POA: Diagnosis not present

## 2022-09-16 DIAGNOSIS — Z743 Need for continuous supervision: Secondary | ICD-10-CM | POA: Diagnosis not present

## 2022-09-16 LAB — ETHANOL: Alcohol, Ethyl (B): 303 mg/dL (ref <10)

## 2022-09-16 LAB — RAPID URINE DRUG SCREEN, HOSP PERFORMED
Amphetamines: NOT DETECTED
Barbiturates: NOT DETECTED
Benzodiazepines: NOT DETECTED
Cocaine: NOT DETECTED
Opiates: NOT DETECTED
Tetrahydrocannabinol: NOT DETECTED

## 2022-09-16 LAB — COMPREHENSIVE METABOLIC PANEL
ALT: 23 U/L (ref 0–44)
AST: 20 U/L (ref 15–41)
Albumin: 3.7 g/dL (ref 3.5–5.0)
Alkaline Phosphatase: 58 U/L (ref 38–126)
Anion gap: 9 (ref 5–15)
BUN: 9 mg/dL (ref 6–20)
CO2: 23 mmol/L (ref 22–32)
Calcium: 8.1 mg/dL — ABNORMAL LOW (ref 8.9–10.3)
Chloride: 101 mmol/L (ref 98–111)
Creatinine, Ser: 0.62 mg/dL (ref 0.44–1.00)
GFR, Estimated: >60 mL/min (ref >60)
Glucose, Bld: 105 mg/dL — ABNORMAL HIGH (ref 70–99)
Potassium: 3.7 mmol/L (ref 3.5–5.1)
Sodium: 133 mmol/L — ABNORMAL LOW (ref 135–145)
Total Bilirubin: 0.4 mg/dL (ref 0.3–1.2)
Total Protein: 7 g/dL (ref 6.5–8.1)

## 2022-09-16 LAB — CBC
HCT: 40.4 % (ref 36.0–46.0)
Hemoglobin: 13.2 g/dL (ref 12.0–15.0)
MCH: 30.3 pg (ref 26.0–34.0)
MCHC: 32.7 g/dL (ref 30.0–36.0)
MCV: 92.7 fL (ref 80.0–100.0)
Platelets: 353 10*3/uL (ref 150–400)
RBC: 4.36 MIL/uL (ref 3.87–5.11)
RDW: 12.6 % (ref 11.5–15.5)
WBC: 9.4 10*3/uL (ref 4.0–10.5)
nRBC: 0 % (ref 0.0–0.2)

## 2022-09-16 LAB — CBG MONITORING, ED: Glucose-Capillary: 102 mg/dL — ABNORMAL HIGH (ref 70–99)

## 2022-09-16 LAB — VALPROIC ACID LEVEL: Valproic Acid Lvl: <10 ug/mL — ABNORMAL LOW (ref 50.0–100.0)

## 2022-09-16 MED ORDER — LACTATED RINGERS IV BOLUS
1000.0000 mL | Freq: Once | INTRAVENOUS | Status: AC
  Administered 2022-09-16: 1000 mL via INTRAVENOUS
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
## 2022-09-16 NOTE — Discharge Instructions (Addendum)
It was our pleasure to provide your ER care today - we hope that you feel better.  Avoid alcohol use as it is harmful to your physical health and mental well-being. See resource guide attached in terms of accessing inpatient or outpatient alcohol use treatment programs.   Follow up closely with primary care doctor and behavioral health provider in the coming week.  For mental health issues and/or crisis, you may also go directly to the Behavioral Health Urgent Care Center - they are open 24/7 and walk-ins are welcome.    Return to ER if worse, new symptoms, fevers, chest pain, trouble breathing, or other emergency concern.

## 2022-09-16 NOTE — ED Provider Notes (Addendum)
[place] EMERGENCY DEPARTMENT AT [facility] Provider Note   CSN: [id number] Arrival date & time: 09/16/22  1321     History  Chief Complaint  Patient presents with   Altered Mental Status    Lori Barrett is a 56 y.o. female.  Patient presents via EMS with altered mental status. Pt with hx dementia w behavioral issues. Was noted by first responders to be leaning against steps, not sure if patient fell or not. Upon arousing patient, patient with agitated behavior, yelling to get out of her house, as well as making non-rational statements.  EMS gave haldol 5 mg for her agitated behavior and transported. Pt calm on arrival to ED, but not able to provide additional hx - level 5 caveat.   The history is provided by the patient, medical records and the EMS personnel. The history is limited by the condition of the patient.  Altered Mental Status      Home Medications Prior to Admission medications   Medication Sig Start Date End Date Taking? Authorizing Provider  amLODipine (NORVASC) 10 MG tablet Take 1 tablet (10 mg total) by mouth daily. 03/31/21   Nelwyn Salisbury, MD  ARIPiprazole (ABILIFY) 10 MG tablet TAKE 1 TABLET BY MOUTH EVERYDAY AT BEDTIME 12/21/21   Nelwyn Salisbury, MD  divalproex (DEPAKOTE) 500 MG DR tablet Take 2 tablets (1,000 mg total) by mouth at bedtime. 08/27/21   Nelwyn Salisbury, MD  donepezil (ARICEPT) 10 MG tablet Take 1 tablet (10 mg total) by mouth at bedtime. 03/02/22   Nelwyn Salisbury, MD  losartan-hydrochlorothiazide [facility]) 50-12.5 MG tablet TAKE 1 TABLET BY MOUTH EVERY DAY 02/01/21   Nelwyn Salisbury, MD  Multiple Vitamins-Minerals ([facility] SPECTRAVITE ADULTS) TABS TAKE 1 TABLET BY MOUTH EVERY DAY 04/23/21   Nelwyn Salisbury, MD  risperiDONE (RISPERDAL) 1 MG tablet Take 1 mg by mouth daily. 06/02/22   [provider]      Allergies    Lisinopril and Shellfish allergy    Review of Systems   Review of Systems  Unable to perform ROS: Patient  unresponsive    Physical Exam Updated Vital Signs BP 118/72   Pulse 64   Temp (!) 97.3 F (36.3 C) (Oral)   Resp 18   Ht 1.6 m (5\' 3" )   Wt 75.4 kg   LMP 01/14/2011   SpO2 95%   BMI 29.45 kg/m  Physical Exam Vitals and nursing note reviewed.  Constitutional:      Appearance: Normal appearance. She is well-developed.  HENT:     Head: Atraumatic.     Nose: Nose normal.     Mouth/Throat:     Mouth: Mucous membranes are moist.  Eyes:     General: No scleral icterus.    Conjunctiva/sclera: Conjunctivae normal.     Pupils: Pupils are equal, round, and reactive to light.  Neck:     Vascular: No carotid bruit.     Trachea: No tracheal deviation.  Cardiovascular:     Rate and Rhythm: Normal rate and regular rhythm.     Pulses: Normal pulses.     Heart sounds: Normal heart sounds. No murmur heard.    No friction rub. No gallop.  Pulmonary:     Effort: Pulmonary effort is normal. No respiratory distress.     Breath sounds: Normal breath sounds.  Chest:     Chest wall: No tenderness.  Abdominal:     General: Bowel sounds are normal. There is no  distension.     Palpations: Abdomen is soft.     Tenderness: There is no abdominal tenderness.     Comments: No abd contusion or bruising.   Genitourinary:    Comments: No cva tenderness.  Musculoskeletal:        General: No swelling.     Cervical back: Normal range of motion and neck supple. No rigidity or tenderness. No muscular tenderness.     Comments: CTLS spine, non tender, aligned, no step off. Good rom bil extremities without apparent pain or bony tenderness.   Skin:    General: Skin is warm and dry.     Findings: No rash.  Neurological:     Mental Status: She is alert.     Comments: Calm, sedated. Moves bilateral extremities purposefully.      ED Results / Procedures / Treatments   Labs (all labs ordered are listed, but only abnormal results are displayed) Results for orders placed or performed during the hospital  encounter of 09/16/22  CBC  Result Value Ref Range   WBC 9.4 4.0 - 10.5 K/uL   RBC 4.36 3.87 - 5.11 MIL/uL   Hemoglobin 13.2 12.0 - 15.0 g/dL   HCT 16.1 09.6 - 04.5 %   MCV 92.7 80.0 - 100.0 fL   MCH 30.3 26.0 - 34.0 pg   MCHC 32.7 30.0 - 36.0 g/dL   RDW 40.9 81.1 - 91.4 %   Platelets 353 150 - 400 K/uL   nRBC 0.0 0.0 - 0.2 %  Comprehensive metabolic panel  Result Value Ref Range   Sodium 133 (L) 135 - 145 mmol/L   Potassium 3.7 3.5 - 5.1 mmol/L   Chloride 101 98 - 111 mmol/L   CO2 23 22 - 32 mmol/L   Glucose, Bld 105 (H) 70 - 99 mg/dL   BUN 9 6 - 20 mg/dL   Creatinine, Ser 7.82 0.44 - 1.00 mg/dL   Calcium 8.1 (L) 8.9 - 10.3 mg/dL   Total Protein 7.0 6.5 - 8.1 g/dL   Albumin 3.7 3.5 - 5.0 g/dL   AST 20 15 - 41 U/L   ALT 23 0 - 44 U/L   Alkaline Phosphatase 58 38 - 126 U/L   Total Bilirubin 0.4 0.3 - 1.2 mg/dL   GFR, Estimated >95 >62 mL/min   Anion gap 9 5 - 15  Rapid urine drug screen (hospital performed)  Result Value Ref Range   Opiates NONE DETECTED NONE DETECTED   Cocaine NONE DETECTED NONE DETECTED   Benzodiazepines NONE DETECTED NONE DETECTED   Amphetamines NONE DETECTED NONE DETECTED   Tetrahydrocannabinol NONE DETECTED NONE DETECTED   Barbiturates NONE DETECTED NONE DETECTED  Ethanol  Result Value Ref Range   Alcohol, Ethyl (B) 303 (HH) <10 mg/dL    EKG None  Radiology CT Head Wo Contrast  Result Date: 09/16/2022 CLINICAL DATA:  Mental status change, unknown cause. EXAM: CT HEAD WITHOUT CONTRAST TECHNIQUE: Contiguous axial images were obtained from the base of the skull through the vertex without intravenous contrast. RADIATION DOSE REDUCTION: This exam was performed according to the departmental dose-optimization program which includes automated exposure control, adjustment of the mA and/or kV according to patient size and/or use of iterative reconstruction technique. COMPARISON:  Head CT 03/30/2020. FINDINGS: Brain: No acute intracranial hemorrhage.  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 Gray-white differentiation is otherwise preserved. No hydrocephalus or extra-axial collection. No mass effect or midline shift. Vascular: No hyperdense vessel  or unexpected calcification. Skull: No calvarial fracture or suspicious bone lesion. Skull base is unremarkable. Sinuses/Orbits: Unremarkable. Other: None. IMPRESSION: 1. No acute intracranial process. 2. Unchanged mild chronic small-vessel disease. Electronically Signed   By: Orvan Falconer M.D.   On: 09/16/2022 15:19    Procedures Procedures    Medications Ordered in ED Medications - No data to display  ED Course/ Medical Decision Making/ A&P                             Medical Decision Making Problems Addressed: Acute alcoholic intoxication without complication (HCC): acute illness or injury with systemic symptoms that poses a threat to life or bodily functions Acute alteration in mental status: acute illness or injury with systemic symptoms that poses a threat to life or bodily functions  Amount and/or Complexity of Data Reviewed Independent Historian: EMS    Details: hx External Data Reviewed: notes. Labs: ordered. Decision-making details documented in ED Course. Radiology: ordered and independent interpretation performed. Decision-making details documented in ED Course.  Risk Prescription drug management. Decision regarding hospitalization.   Iv ns. Continuous pulse ox and cardiac monitoring. Labs ordered/sent. Imaging ordered.   Differential diagnosis includes dementia, intoxication, encephalopathy, psychosis, etc. Dispo decision including potential need for admission considered - will get labs and imaging and reassess.   Reviewed nursing notes and prior charts for additional history. External reports reviewed. Additional history from: EMS.   Cardiac monitor: sinus rhythm, rate 70.  Labs reviewed/interpreted by me -  wbc and hgb normal. K normal. Uds neg.   CT reviewed/interpreted by me - no hem.  Additional labs reviewed/interpreted by me - etoh high, 303. Ivf. Metabolize. Depakote level pending.   Recheck pt, arousable. Breathing comfortably. No acute distress.  1534 signed out to Dr Renaye Rakers to f/u with pending labs, reassess post ivf and metabolizing etoh, and dispo appropriately.            Final Clinical Impression(s) / ED Diagnoses Final diagnoses:  Acute alteration in mental status  Acute alcoholic intoxication without complication [facility])    Rx / DC Orders ED Discharge Orders          Ordered    Valproic acid level        09/16/22 1336               Cathren Laine, MD 09/16/22 1534

## 2022-09-26 ENCOUNTER — Ambulatory Visit (HOSPITAL_COMMUNITY)
Admission: EM | Admit: 2022-09-26 | Discharge: 2022-09-27 | Disposition: A | Discharge status: Home / Self Care | Payer: 59

## 2022-09-26 DIAGNOSIS — F69 Unspecified disorder of adult personality and behavior: Secondary | ICD-10-CM | POA: Diagnosis not present

## 2022-09-26 DIAGNOSIS — F909 Attention-deficit hyperactivity disorder, unspecified type: Secondary | ICD-10-CM

## 2022-09-26 DIAGNOSIS — R413 Other amnesia: Secondary | ICD-10-CM | POA: Diagnosis not present

## 2022-09-26 NOTE — Progress Notes (Signed)
   09/26/22 2310  [facility] Triage Screening (Walk-ins at [facility] only)  How Did You Hear About [place]? Family/Friend  What Is the Reason for Your Visit/Call Today? Pt presents to [facility] voluntarily, accompanied by her friends with complaint of confusion and memory problems. Pt called her friend tonight and was unable to remember where she was located. Per friend, pt apparently left her mother's house and began walking around downtown [place] area. Pt has history of dementia as well as depression. Pt is unable to remember what medication she is prescribed at this time. Pt has also recently been seen at [facility] for aggressive behaviors and dementia. Pt denies SI,HI,AVH and substannce/alcohol use.  How Long Has This Been Causing You Problems? <Week  Have You Recently Had Any Thoughts About Hurting Yourself? No  Are You Planning to Commit Suicide/Harm Yourself At This time? No  Have you Recently Had Thoughts About Hurting Someone Karolee Ohs? No  Are You Planning To Harm Someone At This Time? No  Are you currently experiencing any auditory, visual or other hallucinations? No  Have You Used Any Alcohol or Drugs in the Past 24 Hours? No  Do you have any current medical co-morbidities that require immediate attention? No  Clinician description of patient physical appearance/behavior: Pt appears confused at times, but can answer most questions posed. Pt arrived casually dressed with her yoga pants inside out and no shoes  What Do You Feel Would Help You the Most Today? Treatment for Depression or other mood problem  If access to [facility] was not available, would you have sought care in the Emergency Department? Yes  Determination of Need Routine (7 days)  Options For Referral Other: Comment;Medication Management;ED Visit

## 2022-09-26 NOTE — ED Provider Notes (Signed)
Behavioral Health Urgent Care Medical Screening Exam  Patient Name: Lori Barrett MRN: [id number] Date of Evaluation: 09/27/22 Chief Complaint:  walking on the street  Diagnosis:  Final diagnoses:  Behavior concern in adult  Memory changes  Attention deficit hyperactivity disorder (ADHD), unspecified ADHD type    History of Present illness: Lori Barrett is a 57 y.o. female.  Aggressive behavior, acute alteration in memory status, depression and anxiety, ADHD.  Presented to [facility] accompanied by her neighbor, per the patient unable the patient was out walking around evening downtown [place] when the patient was upset, and left her mom's house.  According to the friend the patient does have some memory issues and did try to get her into neurology but the patient would not stay when they got there. Review of patient records show similar presentation, patient was able to answer questions appropriately patient was very calm, and cooperative.  Patient is currently not seeing a psychiatrist or therapist however patient is prescribed ADHD medicine by her PCP.  Patient currently lives with her elderly mother and according to patient she does not have any children or any other siblings.  According to the patient she was upset today because she is concerned for her elderly mother and she actually is ready to go home because she does not want her mom to wake up and not see her in the house.   Face-to-face observation of patient, patient is alert and oriented to person and place but was not oriented to the date.  Patient presented with a pleasant demeanor.  Patient denies SI, HI, AVH or paranoia at this time.  Patient denies illicit drug use denies EtOH stating she has not drink and a long time.  At this time patient does not seem to be influenced by external or internal stimuli does not appear to be a threat to self or others. Writer discussed with both friend and patient the need to call 911 or present  to the nearest ED should patient experience any suicidal ideation, hallucination or detrimental worsening of her mental health.  Patient and neighbor was also encouraged to reach out to patient PCP and try to get patient in to see a neurologist for a neurology assessment.  At the completion of this assessment patient was both mentally and medically stable for discharge.   Recommend discharge for patient to follow-up with their PCP to get a neurology assessment.  Flowsheet Row ED from 09/26/2022 in [facility] ED from 09/16/2022 in [facility] Emergency Department at [facility] ED from 09/13/2022 in [facility] Emergency Department at [facility]  C-SSRS RISK CATEGORY No Risk No Risk No Risk       Psychiatric Specialty Exam  Presentation  General Appearance:Casual  Eye Contact:Good  Speech:Clear and Coherent  Speech Volume:Normal  Handedness:Right   Mood and Affect  Mood: Anxious  Affect: Appropriate   Thought Process  Thought Processes: Coherent  Descriptions of Associations:Circumstantial  Orientation:Full (Time, Place and Person)  Thought Content:WDL    Hallucinations:None  Ideas of Reference:None  Suicidal Thoughts:No  Homicidal Thoughts:No   Sensorium  Memory: Immediate Poor  Judgment: Fair  Insight: Fair   Art therapist  Concentration: Fair  Attention Span: Good  Recall: [employer] of Knowledge: Fair  Language: Fair   Psychomotor Activity  Psychomotor Activity: Normal   Assets  Assets: Manufacturing systems engineer; Desire for Improvement; Physical Health; Social Support   Sleep  Sleep: Fair  Number of hours:  6   Physical Exam: Physical Exam HENT:     Head: Normocephalic.     Nose: Nose normal.  Cardiovascular:     Rate and Rhythm: Normal rate.  Pulmonary:     Effort: Pulmonary effort is normal.  Musculoskeletal:        General: Normal range of motion.      Cervical back: Normal range of motion.  Neurological:     General: No focal deficit present.     Mental Status: She is alert.  Psychiatric:        Mood and Affect: Mood normal.        Behavior: Behavior normal.    Review of Systems  Constitutional: Negative.   HENT: Negative.    Eyes: Negative.   Respiratory: Negative.    Cardiovascular: Negative.   Gastrointestinal: Negative.   Genitourinary: Negative.   Musculoskeletal: Negative.   Skin: Negative.   Neurological: Negative.   Psychiatric/Behavioral:  Positive for memory loss. The patient is nervous/anxious.    Blood pressure (!) 112/98, pulse 78, temperature 97.9 F (36.6 C), temperature source Oral, resp. rate 18, last menstrual period 01/14/2011, SpO2 100 %. There is no height or weight on file to calculate BMI.  Musculoskeletal: Strength & Muscle Tone: within normal limits Gait & Station: normal Patient leans: N/A   [facility] MSE Discharge Disposition for Follow up and Recommendations: Based on my evaluation the patient does not appear to have an emergency medical condition and can be discharged with resources and follow up care in outpatient services for Medication Management   Sindy Guadeloupe, NP 09/27/2022, 5:57 AM

## 2022-09-26 NOTE — Discharge Instructions (Signed)
F/u with outpatient PCP for neurology assessment

## 2022-10-07 ENCOUNTER — Emergency Department (HOSPITAL_COMMUNITY): Payer: Medicare Other

## 2022-10-07 ENCOUNTER — Inpatient Hospital Stay (HOSPITAL_COMMUNITY): Payer: Medicare Other

## 2022-10-07 ENCOUNTER — Inpatient Hospital Stay (HOSPITAL_COMMUNITY)
Admission: EM | Admit: 2022-10-07 | Discharge: 2022-10-17 | DRG: 922 | Disposition: E | Payer: Medicare Other | Attending: Pulmonary Disease | Admitting: Pulmonary Disease

## 2022-10-07 ENCOUNTER — Telehealth: Payer: Self-pay | Admitting: Family Medicine

## 2022-10-07 ENCOUNTER — Other Ambulatory Visit: Payer: Self-pay

## 2022-10-07 DIAGNOSIS — E872 Acidosis, unspecified: Secondary | ICD-10-CM

## 2022-10-07 DIAGNOSIS — T6701XA Heatstroke and sunstroke, initial encounter: Secondary | ICD-10-CM | POA: Diagnosis present

## 2022-10-07 DIAGNOSIS — R579 Shock, unspecified: Secondary | ICD-10-CM

## 2022-10-07 DIAGNOSIS — I959 Hypotension, unspecified: Secondary | ICD-10-CM

## 2022-10-07 DIAGNOSIS — R4182 Altered mental status, unspecified: Principal | ICD-10-CM

## 2022-10-07 DIAGNOSIS — J9601 Acute respiratory failure with hypoxia: Secondary | ICD-10-CM

## 2022-10-07 DIAGNOSIS — D62 Acute posthemorrhagic anemia: Secondary | ICD-10-CM | POA: Diagnosis not present

## 2022-10-07 DIAGNOSIS — Z66 Do not resuscitate: Secondary | ICD-10-CM | POA: Diagnosis present

## 2022-10-07 DIAGNOSIS — A419 Sepsis, unspecified organism: Principal | ICD-10-CM | POA: Diagnosis present

## 2022-10-07 DIAGNOSIS — Z4682 Encounter for fitting and adjustment of non-vascular catheter: Secondary | ICD-10-CM | POA: Diagnosis not present

## 2022-10-07 DIAGNOSIS — Z23 Encounter for immunization: Secondary | ICD-10-CM

## 2022-10-07 DIAGNOSIS — X30XXXA Exposure to excessive natural heat, initial encounter: Secondary | ICD-10-CM

## 2022-10-07 DIAGNOSIS — T2103XA Burn of unspecified degree of upper back, initial encounter: Secondary | ICD-10-CM | POA: Diagnosis present

## 2022-10-07 DIAGNOSIS — Z1152 Encounter for screening for COVID-19: Secondary | ICD-10-CM | POA: Diagnosis not present

## 2022-10-07 DIAGNOSIS — S0181XA Laceration without foreign body of other part of head, initial encounter: Secondary | ICD-10-CM | POA: Diagnosis present

## 2022-10-07 DIAGNOSIS — R0902 Hypoxemia: Secondary | ICD-10-CM | POA: Diagnosis not present

## 2022-10-07 DIAGNOSIS — R918 Other nonspecific abnormal finding of lung field: Secondary | ICD-10-CM | POA: Diagnosis not present

## 2022-10-07 DIAGNOSIS — R55 Syncope and collapse: Secondary | ICD-10-CM | POA: Diagnosis not present

## 2022-10-07 DIAGNOSIS — D65 Disseminated intravascular coagulation [defibrination syndrome]: Secondary | ICD-10-CM | POA: Diagnosis present

## 2022-10-07 DIAGNOSIS — R0689 Other abnormalities of breathing: Secondary | ICD-10-CM | POA: Diagnosis not present

## 2022-10-07 DIAGNOSIS — R569 Unspecified convulsions: Secondary | ICD-10-CM | POA: Diagnosis not present

## 2022-10-07 DIAGNOSIS — G9349 Other encephalopathy: Secondary | ICD-10-CM | POA: Diagnosis present

## 2022-10-07 DIAGNOSIS — R001 Bradycardia, unspecified: Secondary | ICD-10-CM | POA: Diagnosis not present

## 2022-10-07 DIAGNOSIS — Y838 Other surgical procedures as the cause of abnormal reaction of the patient, or of later complication, without mention of misadventure at the time of the procedure: Secondary | ICD-10-CM | POA: Diagnosis not present

## 2022-10-07 DIAGNOSIS — R404 Transient alteration of awareness: Secondary | ICD-10-CM | POA: Diagnosis not present

## 2022-10-07 DIAGNOSIS — R6521 Severe sepsis with septic shock: Secondary | ICD-10-CM | POA: Diagnosis present

## 2022-10-07 DIAGNOSIS — J80 Acute respiratory distress syndrome: Secondary | ICD-10-CM | POA: Diagnosis present

## 2022-10-07 DIAGNOSIS — T82838A Hemorrhage of vascular prosthetic devices, implants and grafts, initial encounter: Secondary | ICD-10-CM | POA: Diagnosis not present

## 2022-10-07 DIAGNOSIS — N179 Acute kidney failure, unspecified: Secondary | ICD-10-CM | POA: Diagnosis present

## 2022-10-07 DIAGNOSIS — S7011XA Contusion of right thigh, initial encounter: Secondary | ICD-10-CM | POA: Diagnosis not present

## 2022-10-07 DIAGNOSIS — R578 Other shock: Secondary | ICD-10-CM | POA: Diagnosis present

## 2022-10-07 DIAGNOSIS — K7200 Acute and subacute hepatic failure without coma: Secondary | ICD-10-CM | POA: Diagnosis not present

## 2022-10-07 DIAGNOSIS — J690 Pneumonitis due to inhalation of food and vomit: Secondary | ICD-10-CM | POA: Diagnosis present

## 2022-10-07 DIAGNOSIS — I468 Cardiac arrest due to other underlying condition: Secondary | ICD-10-CM | POA: Diagnosis present

## 2022-10-07 DIAGNOSIS — R000 Tachycardia, unspecified: Secondary | ICD-10-CM | POA: Diagnosis not present

## 2022-10-07 DIAGNOSIS — E860 Dehydration: Secondary | ICD-10-CM | POA: Diagnosis present

## 2022-10-07 LAB — TYPE AND SCREEN: ABO/RH(D): O POS

## 2022-10-07 LAB — POCT I-STAT 7, (LYTES, BLD GAS, ICA,H+H)
Acid-base deficit: 17 mmol/L — ABNORMAL HIGH (ref 0.0–2.0)
Bicarbonate: 12.8 mmol/L — ABNORMAL LOW (ref 20.0–28.0)
Calcium, Ion: 1.03 mmol/L — ABNORMAL LOW (ref 1.15–1.40)
HCT: 25 % — ABNORMAL LOW (ref 36.0–46.0)
Hemoglobin: 8.5 g/dL — ABNORMAL LOW (ref 12.0–15.0)
O2 Saturation: 99 %
Patient temperature: 99.3
Potassium: 4.1 mmol/L (ref 3.5–5.1)
Sodium: 144 mmol/L (ref 135–145)
TCO2: 14 mmol/L — ABNORMAL LOW (ref 22–32)
pCO2 arterial: 47.5 mmHg (ref 32–48)
pH, Arterial: 7.04 — CL (ref 7.35–7.45)
pO2, Arterial: 186 mmHg — ABNORMAL HIGH (ref 83–108)

## 2022-10-07 LAB — DIC (DISSEMINATED INTRAVASCULAR COAGULATION)PANEL
D-Dimer, Quant: >20 ug/mL-FEU — ABNORMAL HIGH (ref 0.00–0.50)
Fibrinogen: <60 mg/dL — CL (ref 210–475)
INR: >10 (ref 0.8–1.2)
Platelets: 100 10*3/uL — ABNORMAL LOW (ref 150–400)
Prothrombin Time: >90 seconds — ABNORMAL HIGH (ref 11.4–15.2)
Smear Review: NONE SEEN
aPTT: 155 seconds — ABNORMAL HIGH (ref 24–36)

## 2022-10-07 LAB — CBC WITH DIFFERENTIAL/PLATELET
Abs Immature Granulocytes: 0.8 10*3/uL — ABNORMAL HIGH (ref 0.00–0.07)
Abs Immature Granulocytes: 0.77 10*3/uL — ABNORMAL HIGH (ref 0.00–0.07)
Basophils Absolute: 0.1 10*3/uL (ref 0.0–0.1)
Basophils Absolute: 0.1 10*3/uL (ref 0.0–0.1)
Basophils Relative: 1 %
Basophils Relative: 0 %
Eosinophils Absolute: 0 10*3/uL (ref 0.0–0.5)
Eosinophils Absolute: 0 10*3/uL (ref 0.0–0.5)
Eosinophils Relative: 0 %
Eosinophils Relative: 0 %
HCT: 38.7 % (ref 36.0–46.0)
HCT: 17 % — ABNORMAL LOW (ref 36.0–46.0)
Hemoglobin: 12.2 g/dL (ref 12.0–15.0)
Hemoglobin: 5.5 g/dL — CL (ref 12.0–15.0)
Immature Granulocytes: 12 %
Immature Granulocytes: 6 %
Lymphocytes Relative: 38 %
Lymphocytes Relative: 35 %
Lymphs Abs: 2.4 10*3/uL (ref 0.7–4.0)
Lymphs Abs: 4.3 10*3/uL — ABNORMAL HIGH (ref 0.7–4.0)
MCH: 30.2 pg (ref 26.0–34.0)
MCH: 30.2 pg (ref 26.0–34.0)
MCHC: 31.5 g/dL (ref 30.0–36.0)
MCHC: 32.4 g/dL (ref 30.0–36.0)
MCV: 95.8 fL (ref 80.0–100.0)
MCV: 93.4 fL (ref 80.0–100.0)
Monocytes Absolute: 0.3 10*3/uL (ref 0.1–1.0)
Monocytes Absolute: 0.8 10*3/uL (ref 0.1–1.0)
Monocytes Relative: 5 %
Monocytes Relative: 7 %
Neutro Abs: 2.8 10*3/uL (ref 1.7–7.7)
Neutro Abs: 6.3 10*3/uL (ref 1.7–7.7)
Neutrophils Relative %: 44 %
Neutrophils Relative %: 52 %
Platelets: 311 10*3/uL (ref 150–400)
Platelets: 31 10*3/uL — ABNORMAL LOW (ref 150–400)
RBC: 4.04 MIL/uL (ref 3.87–5.11)
RBC: 1.82 MIL/uL — ABNORMAL LOW (ref 3.87–5.11)
RDW: 13.4 % (ref 11.5–15.5)
RDW: 14.1 % (ref 11.5–15.5)
WBC: 6.4 10*3/uL (ref 4.0–10.5)
WBC: 12.2 10*3/uL — ABNORMAL HIGH (ref 4.0–10.5)
nRBC: 0 % (ref 0.0–0.2)
nRBC: 1.4 % — ABNORMAL HIGH (ref 0.0–0.2)

## 2022-10-07 LAB — HEMOGLOBIN A1C
Hgb A1c MFr Bld: 5.3 % (ref 4.8–5.6)
Mean Plasma Glucose: 105.41 mg/dL

## 2022-10-07 LAB — I-STAT VENOUS BLOOD GAS, ED
Acid-base deficit: 13 mmol/L — ABNORMAL HIGH (ref 0.0–2.0)
Bicarbonate: 15.4 mmol/L — ABNORMAL LOW (ref 20.0–28.0)
Calcium, Ion: 1.13 mmol/L — ABNORMAL LOW (ref 1.15–1.40)
HCT: 34 % — ABNORMAL LOW (ref 36.0–46.0)
Hemoglobin: 11.6 g/dL — ABNORMAL LOW (ref 12.0–15.0)
O2 Saturation: 95 %
Potassium: 5 mmol/L (ref 3.5–5.1)
Sodium: 141 mmol/L (ref 135–145)
TCO2: 17 mmol/L — ABNORMAL LOW (ref 22–32)
pCO2, Ven: 45.7 mmHg (ref 44–60)
pH, Ven: 7.136 — CL (ref 7.25–7.43)
pO2, Ven: 97 mmHg — ABNORMAL HIGH (ref 32–45)

## 2022-10-07 LAB — COMPREHENSIVE METABOLIC PANEL
ALT: 31 U/L (ref 0–44)
AST: 48 U/L — ABNORMAL HIGH (ref 15–41)
Albumin: 3.5 g/dL (ref 3.5–5.0)
Alkaline Phosphatase: 57 U/L (ref 38–126)
Anion gap: 14 (ref 5–15)
BUN: 23 mg/dL — ABNORMAL HIGH (ref 6–20)
CO2: 17 mmol/L — ABNORMAL LOW (ref 22–32)
Calcium: 8.7 mg/dL — ABNORMAL LOW (ref 8.9–10.3)
Chloride: 109 mmol/L (ref 98–111)
Creatinine, Ser: 2.63 mg/dL — ABNORMAL HIGH (ref 0.44–1.00)
GFR, Estimated: 12 mL/min — ABNORMAL LOW (ref >60)
Glucose, Bld: 140 mg/dL — ABNORMAL HIGH (ref 70–99)
Potassium: 4.9 mmol/L (ref 3.5–5.1)
Sodium: 140 mmol/L (ref 135–145)
Total Bilirubin: 0.9 mg/dL (ref 0.3–1.2)
Total Protein: 6.6 g/dL (ref 6.5–8.1)

## 2022-10-07 LAB — PREPARE FRESH FROZEN PLASMA

## 2022-10-07 LAB — RESPIRATORY PANEL BY PCR

## 2022-10-07 LAB — BPAM FFP
Blood Product Expiration Date: 202406262359
ISSUE DATE / TIME: 202406212355
ISSUE DATE / TIME: 202406212355

## 2022-10-07 LAB — TROPONIN I (HIGH SENSITIVITY): Troponin I (High Sensitivity): >24000 ng/L (ref <18)

## 2022-10-07 LAB — I-STAT CHEM 8, ED
BUN: 24 mg/dL — ABNORMAL HIGH (ref 8–23)
Calcium, Ion: 1.15 mmol/L (ref 1.15–1.40)
Chloride: 113 mmol/L — ABNORMAL HIGH (ref 98–111)
Creatinine, Ser: 2.2 mg/dL — ABNORMAL HIGH (ref 0.44–1.00)
Glucose, Bld: 136 mg/dL — ABNORMAL HIGH (ref 70–99)
HCT: 35 % — ABNORMAL LOW (ref 36.0–46.0)
Hemoglobin: 11.9 g/dL — ABNORMAL LOW (ref 12.0–15.0)
Potassium: 5 mmol/L (ref 3.5–5.1)
Sodium: 142 mmol/L (ref 135–145)
TCO2: 17 mmol/L — ABNORMAL LOW (ref 22–32)

## 2022-10-07 LAB — MRSA NEXT GEN BY PCR, NASAL: MRSA by PCR Next Gen: NOT DETECTED

## 2022-10-07 LAB — CREATININE, SERUM
Creatinine, Ser: 2.51 mg/dL — ABNORMAL HIGH (ref 0.44–1.00)
GFR, Estimated: 12 mL/min — ABNORMAL LOW (ref >60)

## 2022-10-07 LAB — POCT I-STAT, CHEM 8
BUN: 24 mg/dL — ABNORMAL HIGH (ref 6–20)
Calcium, Ion: 1.15 mmol/L (ref 1.15–1.40)
Chloride: 113 mmol/L — ABNORMAL HIGH (ref 98–111)
Creatinine, Ser: 2.2 mg/dL — ABNORMAL HIGH (ref 0.44–1.00)
Glucose, Bld: 136 mg/dL — ABNORMAL HIGH (ref 70–99)
HCT: 35 % — ABNORMAL LOW (ref 36.0–46.0)
Hemoglobin: 11.9 g/dL — ABNORMAL LOW (ref 12.0–15.0)
Potassium: 5 mmol/L (ref 3.5–5.1)
Sodium: 142 mmol/L (ref 135–145)
TCO2: 17 mmol/L — ABNORMAL LOW (ref 22–32)

## 2022-10-07 LAB — LACTIC ACID, PLASMA
Lactic Acid, Venous: 5.9 mmol/L (ref 0.5–1.9)
Lactic Acid, Venous: 3 mmol/L (ref 0.5–1.9)

## 2022-10-07 LAB — PREPARE CRYOPRECIPITATE: Unit division: 0

## 2022-10-07 LAB — URINALYSIS, W/ REFLEX TO CULTURE (INFECTION SUSPECTED)
Glucose, UA: NEGATIVE mg/dL
Hgb urine dipstick: NEGATIVE
Ketones, ur: 5 mg/dL — AB
Leukocytes,Ua: NEGATIVE
Nitrite: NEGATIVE
Protein, ur: 30 mg/dL — AB
Specific Gravity, Urine: 1.02 (ref 1.005–1.030)
pH: 5 (ref 5.0–8.0)

## 2022-10-07 LAB — BPAM RBC

## 2022-10-07 LAB — RESP PANEL BY RT-PCR (RSV, FLU A&B, COVID)  RVPGX2
Influenza A by PCR: NEGATIVE
Influenza B by PCR: NEGATIVE
Resp Syncytial Virus by PCR: NEGATIVE
SARS Coronavirus 2 by RT PCR: NEGATIVE

## 2022-10-07 LAB — GLUCOSE, CAPILLARY: Glucose-Capillary: 181 mg/dL — ABNORMAL HIGH (ref 70–99)

## 2022-10-07 LAB — STREP PNEUMONIAE URINARY ANTIGEN: Strep Pneumo Urinary Antigen: NEGATIVE

## 2022-10-07 LAB — RAPID URINE DRUG SCREEN, HOSP PERFORMED
Amphetamines: NOT DETECTED
Barbiturates: NOT DETECTED
Benzodiazepines: POSITIVE — AB
Cocaine: NOT DETECTED
Opiates: NOT DETECTED
Tetrahydrocannabinol: NOT DETECTED

## 2022-10-07 LAB — ABO/RH: ABO/RH(D): O POS

## 2022-10-07 LAB — ETHANOL: Alcohol, Ethyl (B): <10 mg/dL (ref <10)

## 2022-10-07 LAB — SALICYLATE LEVEL: Salicylate Lvl: <7 mg/dL — ABNORMAL LOW (ref 7.0–30.0)

## 2022-10-07 LAB — CK: Total CK: 244 U/L — ABNORMAL HIGH (ref 38–234)

## 2022-10-07 LAB — LIPASE, BLOOD: Lipase: 198 U/L — ABNORMAL HIGH (ref 11–51)

## 2022-10-07 LAB — CORTISOL: Cortisol, Plasma: 32.8 ug/dL

## 2022-10-07 LAB — APTT: aPTT: 33 seconds (ref 24–36)

## 2022-10-07 LAB — CBG MONITORING, ED: Glucose-Capillary: 131 mg/dL — ABNORMAL HIGH (ref 70–99)

## 2022-10-07 LAB — PROTIME-INR
INR: 1.4 — ABNORMAL HIGH (ref 0.8–1.2)
Prothrombin Time: 17.1 seconds — ABNORMAL HIGH (ref 11.4–15.2)

## 2022-10-07 LAB — PREPARE RBC (CROSSMATCH)

## 2022-10-07 LAB — ACETAMINOPHEN LEVEL: Acetaminophen (Tylenol), Serum: <10 ug/mL — ABNORMAL LOW (ref 10–30)

## 2022-10-07 LAB — MAGNESIUM: Magnesium: 1.9 mg/dL (ref 1.7–2.4)

## 2022-10-07 LAB — PROCALCITONIN: Procalcitonin: 0.13 ng/mL

## 2022-10-07 MED ORDER — SODIUM CHLORIDE 0.9 % IV SOLN: 250.0000 mL | INTRAVENOUS | Status: DC

## 2022-10-07 MED ORDER — SODIUM CHLORIDE 0.9 % IV SOLN
100.0000 mg | Freq: Two times a day (BID) | INTRAVENOUS | Status: DC
  Administered 2022-10-07 – 2022-10-08 (×2): 100 mg via INTRAVENOUS
  Filled 2022-10-07 (×3): qty 100

## 2022-10-07 MED ORDER — LACTATED RINGERS IV SOLN: INTRAVENOUS | Status: AC

## 2022-10-07 MED ORDER — NOREPINEPHRINE 16 MG/250ML-% IV SOLN
0.0000 ug/min | INTRAVENOUS | Status: DC
  Administered 2022-10-07: 2 ug/min via INTRAVENOUS
  Administered 2022-10-08 (×2): 80 ug/min via INTRAVENOUS
  Administered 2022-10-08: 75 ug/min via INTRAVENOUS
  Administered 2022-10-08 (×2): 80 ug/min via INTRAVENOUS
  Filled 2022-10-07: qty 250
  Filled 2022-10-07: qty 500
  Filled 2022-10-07 (×3): qty 250

## 2022-10-07 MED ORDER — PANTOPRAZOLE SODIUM 40 MG IV SOLR
40.0000 mg | INTRAVENOUS | Status: DC
  Administered 2022-10-08: 40 mg via INTRAVENOUS
  Filled 2022-10-07: qty 10

## 2022-10-07 MED ORDER — VITAMIN K1 10 MG/ML IJ SOLN
10.0000 mg | Freq: Once | INTRAVENOUS | Status: AC
  Administered 2022-10-08: 10 mg via INTRAVENOUS
  Filled 2022-10-07: qty 1

## 2022-10-07 MED ORDER — VANCOMYCIN HCL IN DEXTROSE 1-5 GM/200ML-% IV SOLN: 1000.0000 mg | Freq: Once | INTRAVENOUS | Status: DC

## 2022-10-07 MED ORDER — EPINEPHRINE HCL 5 MG/250ML IV SOLN IN NS
INTRAVENOUS | Status: AC
  Filled 2022-10-07: qty 250

## 2022-10-07 MED ORDER — ACETAMINOPHEN 650 MG RE SUPP
650.0000 mg | Freq: Once | RECTAL | Status: AC
  Administered 2022-10-07: 650 mg via RECTAL

## 2022-10-07 MED ORDER — PHENYLEPHRINE HCL-NACL 20-0.9 MG/250ML-% IV SOLN
INTRAVENOUS | Status: AC
  Filled 2022-10-07: qty 250

## 2022-10-07 MED ORDER — "THROMBI-PAD 3""X3"" EX PADS": 1.0000 | MEDICATED_PAD | Freq: Once | CUTANEOUS | Status: DC

## 2022-10-07 MED ORDER — VASOPRESSIN 20 UNITS/100 ML INFUSION FOR SHOCK
0.0000 [IU]/min | INTRAVENOUS | Status: DC
  Administered 2022-10-07 – 2022-10-08 (×3): 0.04 [IU]/min via INTRAVENOUS
  Filled 2022-10-07 (×2): qty 100

## 2022-10-07 MED ORDER — EPINEPHRINE HCL 5 MG/250ML IV SOLN IN NS
0.5000 ug/min | INTRAVENOUS | Status: DC
  Administered 2022-10-07: 3 ug/min via INTRAVENOUS
  Administered 2022-10-08 (×3): 20 ug/min via INTRAVENOUS
  Filled 2022-10-07 (×3): qty 250

## 2022-10-07 MED ORDER — OXIDIZED CELLULOSE EX PADS
1.0000 | MEDICATED_PAD | Freq: Once | CUTANEOUS | Status: AC
  Administered 2022-10-07: 1 via TOPICAL
  Filled 2022-10-07: qty 1

## 2022-10-07 MED ORDER — NALOXONE HCL 2 MG/2ML IJ SOSY
PREFILLED_SYRINGE | INTRAMUSCULAR | Status: DC | PRN
  Administered 2022-10-07: 2 mg via INTRAVENOUS

## 2022-10-07 MED ORDER — DOCUSATE SODIUM 100 MG PO CAPS: 100.0000 mg | ORAL_CAPSULE | Freq: Two times a day (BID) | ORAL | Status: DC | PRN

## 2022-10-07 MED ORDER — LACTATED RINGERS IV BOLUS (SEPSIS)
1000.0000 mL | Freq: Once | INTRAVENOUS | Status: AC
  Administered 2022-10-07: 1000 mL via INTRAVENOUS

## 2022-10-07 MED ORDER — LIDOCAINE-EPINEPHRINE (PF) 2 %-1:200000 IJ SOLN
INTRAMUSCULAR | Status: AC
  Administered 2022-10-07: 20 mL
  Filled 2022-10-07: qty 20

## 2022-10-07 MED ORDER — SODIUM CHLORIDE 0.9 % IV BOLUS
1000.0000 mL | Freq: Once | INTRAVENOUS | Status: AC
  Administered 2022-10-07: 1000 mL via INTRAVENOUS

## 2022-10-07 MED ORDER — SODIUM BICARBONATE 8.4 % IV SOLN
100.0000 meq | Freq: Once | INTRAVENOUS | Status: AC
  Filled 2022-10-07: qty 100

## 2022-10-07 MED ORDER — CHLORHEXIDINE GLUCONATE CLOTH 2 % EX PADS
6.0000 | MEDICATED_PAD | Freq: Every day | CUTANEOUS | Status: DC
  Administered 2022-10-07: 6 via TOPICAL

## 2022-10-07 MED ORDER — DOCUSATE SODIUM 50 MG/5ML PO LIQD: 100.0000 mg | Freq: Two times a day (BID) | ORAL | Status: DC | PRN

## 2022-10-07 MED ORDER — ACETAMINOPHEN 325 MG PO TABS: 650.0000 mg | ORAL_TABLET | ORAL | Status: DC

## 2022-10-07 MED ORDER — HEPARIN SODIUM (PORCINE) 5000 UNIT/ML IJ SOLN: 5000.0000 [IU] | Freq: Three times a day (TID) | INTRAMUSCULAR | Status: DC

## 2022-10-07 MED ORDER — ACETAMINOPHEN 650 MG RE SUPP
RECTAL | Status: AC
  Filled 2022-10-07: qty 1

## 2022-10-07 MED ORDER — NOREPINEPHRINE 4 MG/250ML-% IV SOLN
0.0000 ug/min | INTRAVENOUS | Status: DC
  Administered 2022-10-07: 60 ug/min via INTRAVENOUS
  Administered 2022-10-07: 25 ug/min via INTRAVENOUS
  Filled 2022-10-07 (×2): qty 250

## 2022-10-07 MED ORDER — LACTATED RINGERS IV BOLUS: 1000.0000 mL | Freq: Once | INTRAVENOUS | Status: DC

## 2022-10-07 MED ORDER — ACETAMINOPHEN 650 MG RE SUPP: 650.0000 mg | RECTAL | Status: DC

## 2022-10-07 MED ORDER — METRONIDAZOLE 500 MG/100ML IV SOLN
500.0000 mg | Freq: Once | INTRAVENOUS | Status: AC
  Administered 2022-10-07: 500 mg via INTRAVENOUS
  Filled 2022-10-07: qty 100

## 2022-10-07 MED ORDER — SODIUM BICARBONATE 8.4 % IV SOLN
INTRAVENOUS | Status: DC
  Filled 2022-10-07 (×3): qty 1000

## 2022-10-07 MED ORDER — PHENYLEPHRINE HCL (PRESSORS) 10 MG/ML IV SOLN
INTRAVENOUS | Status: DC | PRN
  Administered 2022-10-07: 20 mg via INTRAVENOUS

## 2022-10-07 MED ORDER — BUSPIRONE HCL 15 MG PO TABS: 30.0000 mg | ORAL_TABLET | Freq: Three times a day (TID) | ORAL | Status: DC

## 2022-10-07 MED ORDER — SODIUM CHLORIDE 0.9% IV SOLUTION: Freq: Once | INTRAVENOUS | Status: AC

## 2022-10-07 MED ORDER — TRANEXAMIC ACID FOR INHALATION
500.0000 mg | Freq: Once | RESPIRATORY_TRACT | Status: DC
  Filled 2022-10-07 (×2): qty 10
  Filled 2022-10-07: qty 5

## 2022-10-07 MED ORDER — FAMOTIDINE 20 MG PO TABS: 20.0000 mg | ORAL_TABLET | Freq: Two times a day (BID) | ORAL | Status: DC

## 2022-10-07 MED ORDER — VANCOMYCIN HCL 1250 MG/250ML IV SOLN
1250.0000 mg | Freq: Once | INTRAVENOUS | Status: AC
  Administered 2022-10-07: 1250 mg via INTRAVENOUS
  Filled 2022-10-07: qty 250

## 2022-10-07 MED ORDER — SODIUM BICARBONATE 8.4 % IV SOLN
INTRAVENOUS | Status: AC
  Administered 2022-10-07: 50 meq
  Filled 2022-10-07: qty 100

## 2022-10-07 MED ORDER — TETANUS-DIPHTH-ACELL PERTUSSIS 5-2.5-18.5 LF-MCG/0.5 IM SUSY
0.5000 mL | PREFILLED_SYRINGE | Freq: Once | INTRAMUSCULAR | Status: AC
  Administered 2022-10-07: 0.5 mL via INTRAMUSCULAR
  Filled 2022-10-07: qty 0.5

## 2022-10-07 MED ORDER — SODIUM BICARBONATE 8.4 % IV SOLN
INTRAVENOUS | Status: AC
  Administered 2022-10-07: 100 meq via INTRAVENOUS
  Filled 2022-10-07: qty 100

## 2022-10-07 MED ORDER — SODIUM BICARBONATE 8.4 % IV SOLN
INTRAVENOUS | Status: AC
  Administered 2022-10-07: 50 meq
  Filled 2022-10-07: qty 50

## 2022-10-07 MED ORDER — SODIUM BICARBONATE 8.4 % IV SOLN
INTRAVENOUS | Status: AC
  Administered 2022-10-08: 50 meq
  Filled 2022-10-07: qty 200

## 2022-10-07 MED ORDER — VASOPRESSIN 20 UNITS/100 ML INFUSION FOR SHOCK
0.0000 [IU]/min | INTRAVENOUS | Status: DC
  Administered 2022-10-07: 0.04 [IU]/min via INTRAVENOUS

## 2022-10-07 MED ORDER — SODIUM CHLORIDE 0.9 % IV SOLN
250.0000 mL | INTRAVENOUS | Status: DC
  Administered 2022-10-07: 250 mL via INTRAVENOUS

## 2022-10-07 MED ORDER — ESMOLOL HCL-SODIUM CHLORIDE 2000 MG/100ML IV SOLN: 25.0000 ug/kg/min | INTRAVENOUS | Status: DC

## 2022-10-07 MED ORDER — SODIUM CHLORIDE 0.9 % IV SOLN
2.0000 g | INTRAVENOUS | Status: DC
  Administered 2022-10-08: 2 g via INTRAVENOUS
  Filled 2022-10-07: qty 20

## 2022-10-07 MED ORDER — VANCOMYCIN VARIABLE DOSE PER UNSTABLE RENAL FUNCTION (PHARMACIST DOSING): Status: DC

## 2022-10-07 MED ORDER — POLYETHYLENE GLYCOL 3350 17 G PO PACK: 17.0000 g | PACK | Freq: Every day | ORAL | Status: DC | PRN

## 2022-10-07 MED ORDER — ACETAMINOPHEN 160 MG/5ML PO SOLN: 650.0000 mg | ORAL | Status: DC

## 2022-10-07 MED ORDER — INSULIN ASPART 100 UNIT/ML IJ SOLN
0.0000 [IU] | INTRAMUSCULAR | Status: DC
  Administered 2022-10-07 – 2022-10-08 (×2): 2 [IU] via SUBCUTANEOUS
  Administered 2022-10-08: 5 [IU] via SUBCUTANEOUS

## 2022-10-07 MED ORDER — NOREPINEPHRINE 4 MG/250ML-% IV SOLN
2.0000 ug/min | INTRAVENOUS | Status: DC
  Administered 2022-10-07: 10 ug/min via INTRAVENOUS

## 2022-10-07 MED ORDER — SODIUM BICARBONATE 8.4 % IV SOLN
100.0000 meq | Freq: Once | INTRAVENOUS | Status: AC
  Administered 2022-10-07: 100 meq via INTRAVENOUS
  Filled 2022-10-07: qty 50

## 2022-10-07 MED ORDER — LACTATED RINGERS IV BOLUS
1000.0000 mL | Freq: Once | INTRAVENOUS | Status: AC
  Administered 2022-10-07: 1000 mL via INTRAVENOUS

## 2022-10-07 MED ORDER — FENTANYL CITRATE PF 50 MCG/ML IJ SOSY: 50.0000 ug | PREFILLED_SYRINGE | INTRAMUSCULAR | Status: DC | PRN

## 2022-10-07 MED ORDER — SODIUM CHLORIDE 0.9 % IV SOLN
2.0000 g | Freq: Once | INTRAVENOUS | Status: AC
  Administered 2022-10-07: 2 g via INTRAVENOUS
  Filled 2022-10-07: qty 12.5

## 2022-10-07 NOTE — Progress Notes (Signed)
Pharmacy Antibiotic Note  Lori Barrett is a 57 y.o. female admitted on 2022/10/20 with sepsis.  Pharmacy has been consulted for vancomycin dosing. Would assume AKI given Scr 2.2 however no baseline to assess.   Plan: Give IV Vancomycin 1250mg  x1, then will dose based on renal fxn. Not clear how significnat AKI is without having baesline. Patient currently on 2 pressors would ancitipate AKI in AM.  Follow culture data for de-escalation.  Monitor renal function for dose adjustments as indicated.    Height: 5\' 4"  (162.6 cm) IBW/kg (Calculated) : 54.7  Temp (24hrs), Avg:102.1 F (38.9 C), Min:99.3 F (37.4 C), Max:106.3 F (41.3 C)  Recent Labs  Lab 2022/10/20 1759 Oct 20, 2022 1807  WBC 6.4  --   CREATININE 2.63* 2.20*  LATICACIDVEN 5.9*  --     CrCl cannot be calculated (Unknown ideal weight.).    Not on File  Thank you for allowing pharmacy to be a part of this patient's care.  Estill Batten, PharmD, BCCCP  10/20/2022 7:09 PM

## 2022-10-07 NOTE — ED Notes (Signed)
Icepacks applied to patient.

## 2022-10-07 NOTE — Progress Notes (Signed)
eLink Physician-Brief Progress Note Patient Name: Lori Barrett DOB: 04/18/1876 MRN: [id number]   Date of Service  10/01/2022  HPI/Events of Note  Patient is hypotensive.  eICU Interventions  Levophed titrated up to 70 mcg, she is on Vasopressin, BP responded to 2 amps of sodium bicarbonate, will increase Bicarb gtt to 200 ml / hour, cardiology consulted (bedside echo requested from cardiology to define hemodynamic sub-set). Will consider Epinephrine gtt if patient refractory to current pressors or echo suggests significant LV dysfunction.        Thomasene Lot Tremane Spurgeon 09/18/2022, 10:14 PM

## 2022-10-07 NOTE — Progress Notes (Signed)
Transported pt from ED TRA B to 2M11 with bedside RN and CCM PA. Vitals are stable.

## 2022-10-07 NOTE — Progress Notes (Signed)
  Bedside echo evaluation. Request per ICU team.  Poor acoustic windows. Hyperdynamic LV function. Normal RV size and function No pericardial effusion. Basal inferolateral wall hypokinesis. Doppler parameters not measured.

## 2022-10-07 NOTE — Progress Notes (Signed)
eLink Physician-Brief Progress Note Patient Name: Lori Barrett DOB: 1965/11/27 MRN: [id number]   Date of Service  09/30/2022  HPI/Events of Note  Patient essentially in multiple organ systems failure with profound shock, severe metabolic acidosis and DIC (PT > 90).  eICU Interventions  Aggressive Bicarb Rx with increase of gtt to 200 ml / hour, Transfuse 4 units of FFP, Vitamin K 10 units IV x 1, awaiting hemoglobin result as patient likely to need PRBC as well.        Thomasene Lot [name] 10/01/2022, 11:01 PM

## 2022-10-07 NOTE — Progress Notes (Addendum)
    Called Mom [name]: to explain current illness and update about Lori Barrett. Mom agreed she lived in [place], [place] but she refused to come to see patient saying she had no ride. But when I informed her that patient was critically ill and high risk for dying tonight and mom kept repeating "what is wrong with her?", "why is she in the hospital". She kept repeating the same qquestion over an over again despite explainging MODS, critically illness, shock, multi organ failure, heat stroke etc.,     LAter RN Called at my behest and offered to call police to pick up mom and bring mom to bedside: but mom refused   SIGNATURE    Dr. Kalman Shan, M.D., F.C.C.P,  Pulmonary and Critical Care Medicine Staff Physician, [facility] Director - Interstitial Lung Disease  Program  [facility] at [facility] [place], [place], [address]   Pager: [phone], If no answer  -> Check AMION or Try [phone] Telephone (clinical office): [phone] Telephone (research): [phone]  11:17 PM 10-12-2022

## 2022-10-07 NOTE — H&P (Addendum)
NAME:  Lori Barrett, MRN:  [id number], DOB:  04/18/1876, LOS: 0 ADMISSION DATE:  10/04/2022, CONSULTATION DATE:  09/28/2022 REFERRING MD:  Dr. Saundra Shelling - EDP, CHIEF COMPLAINT:  Shock   History of Present Illness:  Lori Barrett is a unidentified adult female of unknown age or past medical history who presented to the ED via EMS after she was found unresponsive outside jail gates.  It is unknown how long patient was unresponsive and down prior to EMS arrival.  It is believed the patient was recently released from jail because she was found outside jail.  She received intranasal Narcan and route with minimal response.  This was repeated on ED arrival with no improvement in mentation.  GCS 3 on ED arrival resulting in emergent intubation, no sedation or paralytic was required.  On ED arrival patient was seen severely hypothermic with Tmax of 105.8, tachypneic, significantly tachycardic, and progressively hypotensive.  At time of admission evaluation vast majority of laboratory workup was still pending.  I-STAT chemistry with creatinine 2.20 and hemoglobin 11.6. Dr Randa Evens placed aline. Dr Marchelle Gearing evaluated patien in ICU . AT that time 100% Fio2, peep 8,  breathing over ventilator, has cough, gag  mottled, bloody secretiions via ET tube, Rt forehad laceration. Unresponive without sedation. On pressors in ICU . . UDS positive for benzo   Massive sloughing of skin consisten with sun burn on upper back  Pertinent  Medical History  Unknown  Significant Hospital Events: Including procedures, antibiotic start and stop dates in addition to other pertinent events   6/21 found down with unknown downtime, septic shock suspected on arrival  Interim History / Subjective:    Objective   Blood pressure (!) 86/39, pulse (!) 144, temperature (!) 105.7 F (40.9 C), resp. rate (!) 32, height 5\' 4"  (1.626 m), SpO2 100 %.    Vent Mode: PRVC FiO2 (%):  [100 %] 100 % Set Rate:  [18 bmp] 18 bmp Vt  Set:  [430 mL] 430 mL PEEP:  [5 cmH20] 5 cmH20 Plateau Pressure:  [31 cmH20] 31 cmH20   Intake/Output Summary (Last 24 hours) at 10/06/2022 1828 Last data filed at 09/19/2022 4098 Gross per 24 hour  Intake 3118.11 ml  Output --  Net 3118.11 ml   There were no vitals filed for this visit.  Examination: General Appearance:  Looks criticall ill  Head:  Normocephalic, without obvious abnormality, atraumatic Eyes:  PERRL - yes, conjunctiva/corneas - muddy     Ears:  Normal external ear canals, both ears Nose:  G tube - no Throat:  ETT TUBE - yes , OG tube - yes Neck:  Supple,  No enlargement/tenderness/nodules Lungs: Clear to auscultation bilaterally, Ventilator   Synchrony - breathing over vent Heart:  S1 and S2 normal, no murmur, CVP - no.  Pressors - levophed and vasopressin Abdomen:  Soft, no masses, no organomegaly Genitalia / Rectal:  Not done Extremities:  Extremities- intact but mottled and dusk Skin:  ntact in exposed areas . Sacral area - not examined Neurologic:  Sedation - none -> RASS - -4 equivalent. Moves all 4s - breathes over vent, coughs, gags, extremitiy movement not observed. CAM-ICU - x . Orientation - x. NO NECK RIGIDITY     Resolved Hospital Problem list     Assessment & Plan:    Acute Respiratory failure due to encephalopathy and acidosis  10/07/2022 -> on 100% fio2 with PO2 140 and clear CXR. Breathing over ventilator  P:   PRVC VAP  bundle  Acute obtunded encephalopathy with hyper-pyrexia. No neck rigidity  P:   Await head CT result Stat eeg LP based on course     Undifferentiated circulatory shock at admi: Clinical suspicion high for likely severe septic shock in the setting of aspiration pneumonia but unable to rule out cardiogenic or distributive shock as well  P: Continue pressors for goal  MAP > 65 Hydration Check cortisol; can consider stress dose steroids for refractory hypotension   Unclear if any structural cardiac  issues  P: Echo troponin  Hyper-pyrexia at admit  106.38F - found down outside a jail.   ? Heat stroke  ? RMSF  ? Panceatitis   ? Sepsis  ? Serotonin syndro but no clonus  ? Rhabdo but low CK  ? Cocaine (but UDS negative)   P:   Check procal Check urine strep, RVP Check blod culture Consider LP when more stable (currently oozing from different sites)  Check tylenol level Check salicuylate level  Antibiotics - Cefepime 2022-10-20 - 10-20-2022  - CEftriaxione October 20, 2022  - Doxy 10-20-2022 - Vanc 10-20-22   Temperature control - consider cooling blanket   AKI at admit due to dehydration and shock - at admit SEvere metabolic and lactic acidosis - aat admit  P:  Fluids Avoid nephrotoxin Bic bolus and gtt Track lacate  At risk for stress ulcer  P:   PPI  Mild anemia at admission Bleeding easily from different sites but platetelt an dINR ok   P:  - PRBC for hgb </= 6.9gm%    - exceptions are   -  if ACS susepcted/confirmed then transfuse for hgb </= 8.0gm%,  or    -  active bleeding with hemodynamic instability, then transfuse regardless of hemoglobin value   At at all times try to transfuse 1 unit prbc as possible with exception of active hemorrhage   - Check DIC panel - local compreesion   At risk from hyper and   hypogluycemia  P:   ssi  MSK/DERM Laceration right forehead Massive Sun burn area upper back  Plan tDAP vaccine    Best Practice (right click and "Reselect all SmartList Selections" daily)   Diet/type: NPO DVT prophylaxis: SCD GI prophylaxis: PPI Lines: N/A Foley:  N/A Code Status:  full code Last date of multidisciplinary goals of care discussion: Pending - need to figur3e out famil     ATTESTATION & SIGNATURE   The patient Lori Barrett is critically ill with multiple organ systems failure and requires high complexity decision making for assessment and support, frequent evaluation and titration of therapies, application of  advanced monitoring technologies and extensive interpretation of multiple databases and discussion with other appropriate health care personnel such as bedside nurses, social workers, case Production designer, theatre/television/film, Pharmacologist, respiratory therapists, nutritionists, secretaries etc.,  Critical care time includes but is not restricted to just documentation time. Documentation can happen in parallel or sequential to care time depending on case mix urgency and priorities for the shift. So, overall critical Care Time devoted to patient care services described in this note is  60  Minutes.   This time reflects time of care of this signee Dr Kalman Shan which includ does not reflect procedure time, or teaching time or supervisory time of PA/NP/Med student/Med Resident etc but could involve care discussion time     Dr. Kalman Shan, M.D., [facility].C.P Pulmonary and Critical Care Medicine Staff Physician, [facility] Pager: [phone], If no answer  or between  15:00h - 7:00h: call [phone]  2022/10/16 7:51 PM    LABS    PULMONARY Recent Labs  Lab 10-16-2022 1807 Oct 16, 2022 1928  PHART  --  7.040*  PCO2ART  --  47.5  PO2ART  --  186*  HCO3 15.4* 12.8*  TCO2 17*  17* 14*  O2SAT 95 99    CBC Recent Labs  Lab Oct 16, 2022 1759 10-16-2022 1807  HGB 12.2 11.6*  11.9*  HCT 38.7 34.0*  35.0*  WBC 6.4  --   PLT 311  --     COAGULATION Recent Labs  Lab 10-16-2022 1759  INR 1.4*    CARDIAC  No results for input(s): "TROPONINI" in the last 168 hours. No results for input(s): "PROBNP" in the last 168 hours.   CHEMISTRY Recent Labs  Lab 2022-10-16 1759 2022-10-16 1807  NA 140 141  142  K 4.9 5.0  5.0  CL 109 113*  CO2 17*  --   GLUCOSE 140* 136*  BUN 23 24*  CREATININE 2.51*  2.63* 2.20*  CALCIUM 8.7*  --   MG 1.9  --    CrCl cannot be calculated (Unknown ideal weight.).   LIVER Recent Labs  Lab 10-16-22 1759  AST 48*  ALT 31   ALKPHOS 57  BILITOT 0.9  PROT 6.6  ALBUMIN 3.5  INR 1.4*     INFECTIOUS Recent Labs  Lab 10/16/22 1759  LATICACIDVEN 5.9*     ENDOCRINE CBG (last 3)  Recent Labs    10-16-2022 1741 Oct 16, 2022 1945  GLUCAP 131* 181*         IMAGING x48h  - image(s) personally visualized  -   highlighted in bold DG Chest Portable 1 View  Result Date: 2022-10-16 CLINICAL DATA:  Intubation. EXAM: PORTABLE CHEST 1 VIEW COMPARISON:  None Available. FINDINGS: The heart size and mediastinal contours are within normal limits. Endotracheal and nasogastric tubes are in grossly good position. Left lung is clear. Mild right infrahilar atelectasis or infiltrate is noted. The visualized skeletal structures are unremarkable. IMPRESSION: Endotracheal and nasogastric tubes are in grossly good position. Mild right infrahilar subsegmental atelectasis or infiltrate. Electronically Signed   By: Lupita Raider M.D.   On: October 16, 2022 18:21   DG Abd Portable 1 View  Result Date: Oct 16, 2022 CLINICAL DATA:  Enteric tube placement EXAM: PORTABLE ABDOMEN - 1 VIEW COMPARISON:  None Available. FINDINGS: Gastric/enteric tube tip projects over the distal stomach. Diffuse gas-filled dilation of small-bowel loops. Intraluminal gas outlines stool within the ascending colon. IMPRESSION: 1. Gastric/enteric tube tip projects over the distal stomach. 2. Diffuse gas-filled dilation of small-bowel loops, which may represent ileus or obstruction. Electronically Signed   By: Agustin Cree M.D.   On: 10/16/22 18:20

## 2022-10-07 NOTE — Progress Notes (Signed)
EEG complete - results pending 

## 2022-10-07 NOTE — Progress Notes (Signed)
PCCM Interval Progress Note:  7:33 PM Assisted with transport of patient from Trauma B to 2M11. On arrival to bedside, femoral line dressing fully saturated with blood and not sticking to skin. Patient's peri-line area was clipped and dressing reapplied. Dressing quickly resaturated with frank blood. Dressing was removed and manual pressure held for 15+ minutes and dressing reattempted with continued oozing. Site was injected with lidocaine with epinephrine to attempt to control bleeding to no avail. Additionally, a pursestring suture was attempted around the central line access with no improvement in site hemostasis. Additional manual pressure was held on the R femoral access site and Surgicel pad placed with immediate saturation.  10:07PM  Notified by bedside RN of continued bleeding from R femoral access site and hypotension. Escalating pressor requirements. FemoStop was placed to again attempt hemostasis. At this point bleeding appeared externally controlled but a large R thigh hematoma had formed. This appears stable at this time.  ABG 1928 7.040/47.5/186/12.8 Repeat Hgb 5.5, Plt 31; 2U PRBCs, 2U Plt, 2U Cryo, 4U FFP   6:47 AM Continued escalation of vasopressors. Mother updated via phone (please see separate note). Multiple amps of bicarb pushed to maintain SBP. Cardiology to bedside for STAT bedside Echo. EF ok.   3:32 AM  Rhythm deteriorated into bradycardia in 30s, then PEA. Dropped SBP to 40s despite max dose NE, Epi, vaso. Code called. CPR administered x 3-4 minutes with Epi x 2 given. ROSC achieved. Multiple blood products ordered throughout course. Maintained pulse on max support. Post-code, significantly increased size of R thigh hematoma with persistent oozing of blood.   6:05AM Extensor posturing noted on BUE. Persistent increased size of R thigh hematoma with ongoing oozing despite multiple blood products. Persistent hypotension. Worsening hypothermia, unable to place Bair hugger  due to worsening pressures with vasodilation.  Patient continues to worsen clinically with multi-organ system failure and profound coagulopathy despite all efforts at correction. Unfortunately, do not feel this situation is survivable. This has been conveyed to patient's mother, [name], via myself and Dr. Marchelle Gearing.  Tim Lair, PA-C [facility] Oct 13, 2022 6:47 AM  Please see [email or web address] for pager details.  From 7A-7P if no response, please call [phone] After hours, please call ELink [phone]

## 2022-10-07 NOTE — ED Triage Notes (Signed)
Patient found unresponsive outside the jail. No information about the patient, how long she's been there, or preceding events, other than they think she was just released from jail.

## 2022-10-07 NOTE — Procedures (Signed)
Arterial Catheter Insertion Procedure Note  Lori Barrett  [id number]  04/18/1876  Date:10/03/2022  Time:7:30 PM    Provider Performing: Lynnell Catalan    Procedure: Insertion of Arterial Line (40981) with US guidance (19147)   Indication(s) Blood pressure monitoring and/or need for frequent ABGs  Consent Unable to obtain consent due to emergent nature of procedure.  Anesthesia None   Time Out Verified patient identification, verified procedure, site/side was marked, verified correct patient position, special equipment/implants available, medications/allergies/relevant history reviewed, required imaging and test results available.   Sterile Technique Maximal sterile technique including full sterile barrier drape, hand hygiene, sterile gown, sterile gloves, mask, hair covering, sterile ultrasound probe cover (if used).   Procedure Description Area of catheter insertion was cleaned with chlorhexidine and draped in sterile fashion. With real-time ultrasound guidance an arterial catheter was placed into the right femoral artery.  Appropriate arterial tracings confirmed on monitor.     Complications/Tolerance None; patient tolerated the procedure well.   EBL Minimal   Specimen(s) None   Lynnell Catalan, MD [facility] ICU Physician [facility] [place] [facility]  Pager: [phone] Or Epic Secure Chat After hours: [phone].  09/21/2022, 7:40 PM

## 2022-10-07 NOTE — Procedures (Signed)
Patient Name: Lori Barrett  MRN: [id number]  Epilepsy Attending: Charlsie Quest  Referring Physician/Provider: Kalman Shan, MD  Date: 10/18/22 Duration: 25.33 mins  Patient history: unknown year old female with ams getting eeg to evaluate for seizure  Level of alertness:  lethargic   AEDs during EEG study: None  Technical aspects: This EEG study was done with scalp electrodes positioned according to the 10-20 International system of electrode placement. Electrical activity was reviewed with band pass filter of 1-70Hz , sensitivity of 7 uV/mm, display speed of 3mm/sec with a 60Hz  notched filter applied as appropriate. EEG data were recorded continuously and digitally stored.  Video monitoring was available and reviewed as appropriate.  Description: EEG showed continuous generalized low amplitude 2-3hz  delta slowing.  Hyperventilation and photic stimulation were not performed.    Of note, EEG was technically difficult due to significant myogenic and ventilator artifact.    ABNORMALITY - Continuous slow, generalized  IMPRESSION: This technically difficult study is suggestive of severe diffuse encephalopathy, nonspecific etiology. No seizures or epileptiform discharges were seen throughout the recording.  Nazim Kadlec Annabelle Harman

## 2022-10-07 NOTE — Progress Notes (Signed)
IV team consulted for [place] PIV. Upon arrival to room, RN stated no new access needed at this time.

## 2022-10-07 NOTE — Progress Notes (Signed)
eLink Physician-Brief Progress Note Patient Name: Lori Barrett DOB: 04/18/1876 MRN: [id number]   Date of Service  10/27/22  HPI/Events of Note  Patient admitted with altered mental status, acute respiratory failure requiring intubation for airway protection, and fever, work up is in progress.  eICU Interventions  New Patient Evaluation.        Migdalia Dk 10-27-22, 8:27 PM

## 2022-10-07 NOTE — Progress Notes (Signed)
PCCM:  Able to charge patient's phone. Contacted number listed as "Mom" [phone]. Able to get in touch with patient's mother, who would not give me her name.   She identified patient as: Lori Barrett DOB 1965/10/14  Attempted to update patient's mother on the information available at the current time. She was having a difficult time understanding the circumstances surrounding her daughter's presentation to ED. Multiple times myself and Charge Nurse, Hayley, attempted to explain to patient's mother what information is known at this time. We explained that patient was found unresponsive outside of the jail with unknown downtime, was hyperthermic to 106F and completely unresponsive. She required assistance with respirations. Intubated, on ventilator. Multiple times patient's mother asked repetitive questions about circumstances and multiple times she was told that only the above information is known.  Patient's location was disclosed to mother in the event she would like to visit.  Tim Lair, PA-C [facility] 10-17-2022 10:56 PM  Please see [email or web address] for pager details.  From 7A-7P if no response, please call [phone] After hours, please call ELink [phone]

## 2022-10-07 NOTE — ED Notes (Signed)
Multiple ice packs placed behind head, behind bilateral axilla and bilateral legs to cool pt.

## 2022-10-07 NOTE — ED Notes (Signed)
ED TO INPATIENT HANDOFF REPORT  ED Nurse Name and Phone #: Waunita Schooner Name/Age/Gender [place] Lori Barrett 57 y.o. female Room/Bed: TRABC/TRABC  Code Status   Code Status: Full Code  Home/SNF/Other Home  Triage Complete: Triage complete  Chief Complaint Heat stroke [T67.01XA]  Triage Note Patient found unresponsive outside the jail. No information about the patient, how long she's been there, or preceding events, other than they think she was just released from jail.    Allergies Not on File  Level of Care/Admitting Diagnosis ED Disposition     ED Disposition  Admit   Condition  --   Comment  Hospital Area: [facility] [[id number]]  Level of Care: ICU [6]  May admit patient to [facility] or [facility] if equivalent level of care is available:: Yes  Covid Evaluation: Asymptomatic - no recent exposure (last 10 days) testing not required  Diagnosis: Heat stroke [161096]  Admitting Physician: Lynnell Catalan [[id number]]  Attending Physician: Lynnell Catalan [phone]  Certification:: I certify this patient will need inpatient services for at least 2 midnights  Estimated Length of Stay: 5          B Medical/Surgery History No past medical history on file.    A IV Location/Drains/Wounds Patient Lines/Drains/Airways Status     Active Line/Drains/Airways     Name Placement date Placement time Site Days   Peripheral IV 10-18-2022 18 G Left Antecubital 18-Oct-2022  1742  Antecubital  less than 1   Peripheral IV 10-18-2022 18 G Right Antecubital Oct 18, 2022  1754  Antecubital  less than 1   Peripheral IV 2022-10-18 20 G Anterior;Left Hand 10/18/2022  1824  Hand  less than 1   CVC Triple Lumen 10/18/22 Right Femoral 2022/10/18  1904  -- less than 1   NG/OG Vented/Dual Lumen 16 Fr. Oral 2022-10-18  1754  Oral  less than 1   Urethral Catheter Katrina Temperature probe 14 Fr. 10/18/22  1757  Temperature probe  less than 1   Airway 7.5 mm Oct 18, 2022  1824  -- less than 1             Intake/Output Last 24 hours  Intake/Output Summary (Last 24 hours) at 10/18/2022 1906 Last data filed at 10/18/2022 1850 Gross per 24 hour  Intake 3289.54 ml  Output --  Net 3289.54 ml    Labs/Imaging Results for orders placed or performed during the hospital encounter of 10/18/2022 (from the past 48 hour(s))  CBG monitoring, ED     Status: Abnormal   Collection Time: 2022/10/18  5:41 PM  Result Value Ref Range   Glucose-Capillary 131 (H) 70 - 99 mg/dL    Comment: Glucose reference range applies only to samples taken after fasting for at least 8 hours.  Lactic acid, plasma     Status: Abnormal   Collection Time: 18-Oct-2022  5:59 PM  Result Value Ref Range   Lactic Acid, Venous 5.9 (HH) 0.5 - 1.9 mmol/L    Comment: CRITICAL RESULT CALLED TO, READ BACK BY AND VERIFIED WITH Wilhemena Durie, RN AT 1846 October 18, 2022 D. BLU Performed at [facility] Lab, [address], [place], [place] [address]   Comprehensive metabolic panel     Status: Abnormal   Collection Time: 10-18-22  5:59 PM  Result Value Ref Range   Sodium 140 135 - 145 mmol/L   Potassium 4.9 3.5 - 5.1 mmol/L   Chloride 109 98 - 111 mmol/L   CO2 17 (L) 22 - 32 mmol/L  Glucose, Bld 140 (H) 70 - 99 mg/dL    Comment: Glucose reference range applies only to samples taken after fasting for at least 8 hours.   BUN 23 8 - 23 mg/dL   Creatinine, Ser 9.60 (H) 0.44 - 1.00 mg/dL   Calcium 8.7 (L) 8.9 - 10.3 mg/dL   Total Protein 6.6 6.5 - 8.1 g/dL   Albumin 3.5 3.5 - 5.0 g/dL   AST 48 (H) 15 - 41 U/L   ALT 31 0 - 44 U/L   Alkaline Phosphatase 57 38 - 126 U/L   Total Bilirubin 0.9 0.3 - 1.2 mg/dL   GFR, Estimated 12 (L) >60 mL/min    Comment: (NOTE) Calculated using the CKD-EPI Creatinine Equation (2021)    Anion gap 14 5 - 15    Comment: Performed at [facility], [address] [place], [place] [address]  CBC with Differential     Status: Abnormal   Collection Time: 09/27/2022  5:59 PM  Result Value Ref Range    WBC 6.4 4.0 - 10.5 K/uL   RBC 4.04 3.87 - 5.11 MIL/uL   Hemoglobin 12.2 12.0 - 15.0 g/dL   HCT 81.1 91.4 - 78.2 %   MCV 95.8 80.0 - 100.0 fL   MCH 30.2 26.0 - 34.0 pg   MCHC 31.5 30.0 - 36.0 g/dL   RDW 95.6 21.3 - 08.6 %   Platelets 311 150 - 400 K/uL   nRBC 0.0 0.0 - 0.2 %   Neutrophils Relative % 44 %   Neutro Abs 2.8 1.7 - 7.7 K/uL   Lymphocytes Relative 38 %   Lymphs Abs 2.4 0.7 - 4.0 K/uL   Monocytes Relative 5 %   Monocytes Absolute 0.3 0.1 - 1.0 K/uL   Eosinophils Relative 0 %   Eosinophils Absolute 0.0 0.0 - 0.5 K/uL   Basophils Relative 1 %   Basophils Absolute 0.1 0.0 - 0.1 K/uL   WBC Morphology Mild Left Shift (1-5% metas, occ myelo)    RBC Morphology MORPHOLOGY UNREMARKABLE    Smear Review MORPHOLOGY UNREMARKABLE    Immature Granulocytes 12 %   Abs Immature Granulocytes 0.80 (H) 0.00 - 0.07 K/uL    Comment: Performed at [facility], [address], [place], [place] [address]  Protime-INR     Status: Abnormal   Collection Time: 09/22/2022  5:59 PM  Result Value Ref Range   Prothrombin Time 17.1 (H) 11.4 - 15.2 seconds   INR 1.4 (H) 0.8 - 1.2    Comment: (NOTE) INR goal varies based on device and disease states. Performed at [facility], [address], [place], [place] [address]   APTT     Status: None   Collection Time: 10/16/2022  5:59 PM  Result Value Ref Range   aPTT 33 24 - 36 seconds    Comment: Performed at [facility], [address], [place], [place] [address]  CK     Status: Abnormal   Collection Time: 10/09/2022  5:59 PM  Result Value Ref Range   Total CK 244 (H) 38 - 234 U/L    Comment: Performed at [facility], [address] [place], [place] [address]  Ethanol     Status: None   Collection Time: 10/11/2022  5:59 PM  Result Value Ref Range   Alcohol, Ethyl (B) <10 <10 mg/dL    Comment: (NOTE) Lowest detectable limit for serum alcohol is 10 mg/dL.  For medical purposes  only. Performed at [facility], [address] [place], [place] [address]   I-stat chem 8, ED (not at [facility], [facility] or [facility])     Status: Abnormal   Collection Time: 10-19-22  6:07 PM  Result Value Ref Range   Sodium 142 135 - 145 mmol/L   Potassium 5.0 3.5 - 5.1 mmol/L   Chloride 113 (H) 98 - 111 mmol/L   BUN 24 (H) 8 - 23 mg/dL   Creatinine, Ser 4.09 (H) 0.44 - 1.00 mg/dL   Glucose, Bld 811 (H) 70 - 99 mg/dL    Comment: Glucose reference range applies only to samples taken after fasting for at least 8 hours.   Calcium, Ion 1.15 1.15 - 1.40 mmol/L   TCO2 17 (L) 22 - 32 mmol/L   Hemoglobin 11.9 (L) 12.0 - 15.0 g/dL   HCT 91.4 (L) 78.2 - 95.6 %  I-Stat venous blood gas, ([facility] ED, [facility], [facility])     Status: Abnormal   Collection Time: 10/19/2022  6:07 PM  Result Value Ref Range   pH, Ven 7.136 (LL) 7.25 - 7.43   pCO2, Ven 45.7 44 - 60 mmHg   pO2, Ven 97 (H) 32 - 45 mmHg   Bicarbonate 15.4 (L) 20.0 - 28.0 mmol/L   TCO2 17 (L) 22 - 32 mmol/L   O2 Saturation 95 %   Acid-base deficit 13.0 (H) 0.0 - 2.0 mmol/L   Sodium 141 135 - 145 mmol/L   Potassium 5.0 3.5 - 5.1 mmol/L   Calcium, Ion 1.13 (L) 1.15 - 1.40 mmol/L   HCT 34.0 (L) 36.0 - 46.0 %   Hemoglobin 11.6 (L) 12.0 - 15.0 g/dL   Sample type VENOUS   Urinalysis, w/ Reflex to Culture (Infection Suspected) -Urine, Clean Catch     Status: Abnormal   Collection Time: 10/19/2022  6:08 PM  Result Value Ref Range   Specimen Source URINE, CLEAN CATCH    Color, Urine AMBER (A) YELLOW    Comment: BIOCHEMICALS MAY BE AFFECTED BY COLOR   APPearance HAZY (A) CLEAR   Specific Gravity, Urine 1.020 1.005 - 1.030   pH 5.0 5.0 - 8.0   Glucose, UA NEGATIVE NEGATIVE mg/dL   Hgb urine dipstick NEGATIVE NEGATIVE   Bilirubin Urine SMALL (A) NEGATIVE   Ketones, ur 5 (A) NEGATIVE mg/dL   Protein, ur 30 (A) NEGATIVE mg/dL   Nitrite NEGATIVE NEGATIVE   Leukocytes,Ua NEGATIVE NEGATIVE   RBC / HPF 0-5 0 - 5 RBC/hpf   WBC, UA 0-5 0 - 5 WBC/hpf    Comment:        Reflex urine culture not performed if WBC  <=10, OR if Squamous epithelial cells >5. If Squamous epithelial cells >5 suggest recollection.    Bacteria, UA RARE (A) NONE SEEN   Squamous Epithelial / HPF 0-5 0 - 5 /HPF   Mucus PRESENT    Hyaline Casts, UA PRESENT    Granular Casts, UA PRESENT     Comment: Performed at [facility], [address], [place], [place] [address]  Rapid urine drug screen (hospital performed)     Status: Abnormal   Collection Time: October 19, 2022  6:08 PM  Result Value Ref Range   Opiates NONE DETECTED NONE DETECTED   Cocaine NONE DETECTED NONE DETECTED   Benzodiazepines POSITIVE (A) NONE DETECTED   Amphetamines NONE DETECTED NONE DETECTED   Tetrahydrocannabinol NONE DETECTED NONE DETECTED   Barbiturates NONE DETECTED NONE DETECTED    Comment: (NOTE)  DRUG SCREEN FOR MEDICAL PURPOSES ONLY.  IF CONFIRMATION IS NEEDED FOR ANY PURPOSE, NOTIFY LAB WITHIN 5 DAYS.  LOWEST DETECTABLE LIMITS FOR URINE DRUG SCREEN Drug Class                     Cutoff (ng/mL) Amphetamine and metabolites    1000 Barbiturate and metabolites    200 Benzodiazepine                 200 Opiates and metabolites        300 Cocaine and metabolites        300 THC                            50 Performed at [facility], [address] [place], [place] [address]    DG Chest Portable 1 View  Result Date: 09/30/2022 CLINICAL DATA:  Intubation. EXAM: PORTABLE CHEST 1 VIEW COMPARISON:  None Available. FINDINGS: The heart size and mediastinal contours are within normal limits. Endotracheal and nasogastric tubes are in grossly good position. Left lung is clear. Mild right infrahilar atelectasis or infiltrate is noted. The visualized skeletal structures are unremarkable. IMPRESSION: Endotracheal and nasogastric tubes are in grossly good position. Mild right infrahilar subsegmental atelectasis or infiltrate. Electronically Signed   By: Lupita Raider M.D.   On: 09/29/2022 18:21   DG Abd Portable 1 View  Result Date:  10/12/2022 CLINICAL DATA:  Enteric tube placement EXAM: PORTABLE ABDOMEN - 1 VIEW COMPARISON:  None Available. FINDINGS: Gastric/enteric tube tip projects over the distal stomach. Diffuse gas-filled dilation of small-bowel loops. Intraluminal gas outlines stool within the ascending colon. IMPRESSION: 1. Gastric/enteric tube tip projects over the distal stomach. 2. Diffuse gas-filled dilation of small-bowel loops, which may represent ileus or obstruction. Electronically Signed   By: Agustin Cree M.D.   On: 09/26/2022 18:20    Pending Labs Unresulted Labs (From admission, onward)     Start     Ordered   2022-10-24 0500  Blood gas, arterial  Daily,   R      10/13/2022 1836   10/24/22 0500  Comprehensive metabolic panel  Daily,   R      09/23/2022 1836   24-Oct-2022 0500  CBC with Differential/Platelet  Daily,   R      09/21/2022 1836   10/16/2022 1900  Blood gas, arterial  Once,   R        09/28/2022 1809   09/19/2022 1836  Hemoglobin A1c  (Glycemic Control (SSI)  Q 4 Hours / Glycemic Control (SSI)  AC +/- HS)  Once,   R       Comments: To assess prior glycemic control    09/27/2022 1836   10/03/2022 1836  Magnesium  ONCE - STAT,   STAT       Comments: If not done within the previous 24 hours    09/27/2022 1836   10/06/2022 1833  Creatinine, serum  (heparin)  Once,   R       Comments: Baseline for heparin therapy IF NOT ALREADY DRAWN.    09/21/2022 1836   09/30/2022 1759  Pathologist smear review  Once,   STAT        09/30/2022 1759   10/02/2022 1753  Resp panel by RT-PCR (RSV, Flu A&B, Covid) Anterior Nasal Swab  (Septic presentation on arrival (screening labs, nursing and treatment orders for obvious sepsis))  Once,  URGENT        10/13/2022 1753   09/30/2022 1753  Lactic acid, plasma  (Septic presentation on arrival (screening labs, nursing and treatment orders for obvious sepsis))  Now then every 2 hours,   R (with STAT occurrences)      09/19/2022 1753   09/26/2022 1753  Blood Culture (routine x 2)  (Septic presentation on  arrival (screening labs, nursing and treatment orders for obvious sepsis))  BLOOD CULTURE X 2,   STAT      10/15/2022 1753            Vitals/Pain Today's Vitals   09/24/2022 1854 10/03/2022 1900 10/13/2022 1901 09/17/2022 1902  BP: 107/64 (!) 107/51 (!) 106/53 105/65  Pulse: (!) 132 (!) 132 (!) 131 (!) 131  Resp: (!) 30 (!) 29 (!) 29 (!) 29  Temp: 99.4 F (37.4 C) 99.4 F (37.4 C) 99.5 F (37.5 C) 99.3 F (37.4 C)  SpO2: 92% 98% 95% 99%  Height:        Isolation Precautions No active isolations  Medications Medications  naloxone (NARCAN) injection (2 mg Intravenous Given 10/02/2022 1741)  phenylephrine (NEO-SYNEPHRINE) injection (20 mg Intravenous Given 09/27/2022 1747)  lactated ringers infusion (has no administration in time range)  metroNIDAZOLE (FLAGYL) IVPB 500 mg (500 mg Intravenous New Bag/Given 10/06/2022 1820)  vancomycin (VANCOREADY) IVPB 1250 mg/250 mL (1,250 mg Intravenous New Bag/Given 10/11/2022 1830)  0.9 %  sodium chloride infusion (250 mLs Intravenous Not Given 09/19/2022 1803)  lactated ringers bolus 1,000 mL (1,000 mLs Intravenous Not Given 09/24/2022 1854)  polyethylene glycol (MIRALAX / GLYCOLAX) packet 17 g (has no administration in time range)  heparin injection 5,000 Units (has no administration in time range)  famotidine (PEPCID) tablet 20 mg (has no administration in time range)  0.9 %  sodium chloride infusion (has no administration in time range)  vasopressin (PITRESSIN) 20 Units in sodium chloride 0.9 % 100 mL infusion-*FOR SHOCK* (0.04 Units/min Intravenous New Bag/Given 09/29/2022 1848)  norepinephrine (LEVOPHED) 4mg  in (0.016 mg/mL) premix infusion (60 mcg/min Intravenous New Bag/Given 09/20/2022 1849)  insulin aspart (novoLOG) injection 0-9 Units (has no administration in time range)  acetaminophen (TYLENOL) tablet 650 mg ( Oral See Alternative 10/16/2022 1847)    Or  acetaminophen (TYLENOL) 160 MG/5ML solution 650 mg ( Per Tube See Alternative 09/29/2022 1847)    Or   acetaminophen (TYLENOL) suppository 650 mg (650 mg Rectal Not Given 10/16/2022 1847)  busPIRone (BUSPAR) tablet 30 mg (30 mg Oral Not Given 10/10/2022 1847)    Or  busPIRone (BUSPAR) tablet 30 mg ( Per Tube See Alternative 10/16/2022 1847)  docusate (COLACE) 50 MG/5ML liquid 100 mg (has no administration in time range)  cefTRIAXone (ROCEPHIN) 2 g in sodium chloride 0.9 % 100 mL IVPB (has no administration in time range)  fentaNYL (SUBLIMAZE) injection 50 mcg (has no administration in time range)  lactated ringers bolus 1,000 mL (0 mLs Intravenous Stopped 10/04/2022 1822)    And  lactated ringers bolus 1,000 mL (0 mLs Intravenous Stopped 09/25/2022 1822)    And  lactated ringers bolus 1,000 mL (0 mLs Intravenous Stopped 10/16/2022 1822)  ceFEPIme (MAXIPIME) 2 g in sodium chloride 0.9 % 100 mL IVPB (0 g Intravenous Stopped 09/21/2022 1822)  acetaminophen (TYLENOL) suppository 650 mg (650 mg Rectal Given 10/05/2022 1805)  sodium chloride 0.9 % bolus 1,000 mL (1,000 mLs Intravenous New Bag/Given 10/05/2022 1830)  sodium chloride 0.9 % bolus 1,000 mL (1,000 mLs Intravenous New Bag/Given 09/25/2022 1852)  sodium chloride 0.9 % bolus 1,000 mL (1,000 mLs Intravenous New Bag/Given 10/12/22 1853)    Mobility walks     Focused Assessments Neuro Assessment Handoff:  Swallow screen pass? No          Neuro Assessment:   Neuro Checks:      Has TPA been given? No If patient is a Neuro Trauma and patient is going to OR before floor call report to 4N Charge nurse: [phone] or [phone]   R Recommendations: See Admitting Provider Note  Report given to:   Additional Notes:

## 2022-10-07 NOTE — ED Provider Notes (Addendum)
Supervised resident visit.  Please see resident note for further results evaluation disposition of the patient.  Level 5 caveat as patient arrives obtunded.  Found outside of the prison where she was presumed may be to be let go today.  Narcan in the field did not work.  Bag valve ventilations provided by EMS.  Blood sugar was normal.  She had a elevated temperature.  She arrives obtunded here.  She was intubated without any sedation.  Her initial blood pressure was 118/74.  She was hypoxic in the 80s.  However she became hypotensive and tachycardic.  Her pupils were equal.  No signs of trauma on exam.  My suspicion is that she either had prolonged heat exposure/may be overdose with aspiration.  She arrives with a temperature of 104 axillary.  She was successfully intubated.  Started on both Levophed and eventually vasopressin.  She is given 4 L of IV fluids.  Temp Foley catheter was placed and showed temperature of 106.3.  Cooling measures were enacted with ice bags.  Broad-spectrum IV antibiotics started.  Sepsis workup initiated.  VBG showed a pH of 7.136.  Electrolytes unremarkable creatinine 2.2.  No significant leukocytosis.  Chest x-ray shows adequate ETT placement but also likely aspiration pneumonia.  Overall this could be all prolonged heat exposure leading to an aspiration pneumonia.  Her hemodynamics started to improve.  She is to be admitted to the ICU team.  Blood pressure upon admission to the ICU was 95/41.  She continues to be on 2 IV vasopressors.  Head CT has been ordered to rule out any head injury.  I supervised resident on intubation.  This chart was dictated using voice recognition software.  Despite best efforts to proofread,  errors can occur which can change the documentation meaning.   .Critical Care  Performed by: Virgina Norfolk, DO Authorized by: Virgina Norfolk, DO   Critical care provider statement:    Critical care time (minutes):  45   Critical care was necessary to treat or  prevent imminent or life-threatening deterioration of the following conditions:  Sepsis, shock and respiratory failure   Critical care was time spent personally by me on the following activities:  Blood draw for specimens, development of treatment plan with patient or surrogate, evaluation of patient's response to treatment, discussions with primary provider, examination of patient, obtaining history from patient or surrogate, ordering and performing treatments and interventions, ordering and review of laboratory studies, ordering and review of radiographic studies, pulse oximetry, re-evaluation of patient's condition and review of old charts   I assumed direction of critical care for this patient from another provider in my specialty: no       Virgina Norfolk, DO October 24, 2022 1837    Virgina Norfolk, DO 2022/10/24 1838

## 2022-10-07 NOTE — ED Provider Notes (Signed)
[place] EMERGENCY DEPARTMENT AT [facility] Provider Note   HPI: Lori Barrett is a female of unknown age or past medical history presenting due to unresponsiveness.  The patient arrives with EMS.  They report the patient was found unresponsive outside of jail.  There is no information regarding the patient or how long she had been there.  They report she was febrile however was hemodynamically stable.  She was given 8 mg of intranasal Narcan with fire without improvement.  They report the patient was minimally responsive and they have been assisting with respirations in transit.  No past medical history on file.     Review of Systems  Unable to perform ROS: Mental status change      Vitals:   November 03, 2022 1820 11/03/22 1825  BP: (!) 93/36 (!) 95/41  Pulse: (!) 141 (!) 138  Resp: (!) 32 (!) 32  Temp: (!) 104.9 F (40.5 C) (!) 104.4 F (40.2 C)  SpO2: (!) 85% 100%    Physical Exam Constitutional:      Comments: Warm to palpation  Cardiovascular:     Rate and Rhythm: Regular rhythm. Tachycardia present.     Pulses: Normal pulses.     Heart sounds: Normal heart sounds. No murmur heard.    No friction rub. No gallop.  Pulmonary:     Effort: Tachypnea, respiratory distress and retractions present.     Breath sounds: Rales present.     Comments: Patient is initiating spontaneous respirations Abdominal:     General: Abdomen is flat. There is no distension.     Palpations: Abdomen is soft.     Tenderness: There is no abdominal tenderness. There is no guarding or rebound.  Neurological:     Mental Status: She is lethargic.     Procedure Name: Intubation Date/Time: 11/03/22 6:30 PM  Performed by: Chase Caller, MDPre-anesthesia Checklist: Emergency Drugs available, Suction available, Patient being monitored, Patient identified and Timeout performed Oxygen Delivery Method: Ambu bag Preoxygenation: Pre-oxygenation with 100% oxygen Ventilation: Mask  ventilation without difficulty Laryngoscope Size: Glidescope Grade View: Grade I Nasal Tubes: Right Tube size: 7.5 mm Number of attempts: 1 Placement Confirmation: ETT inserted through vocal cords under direct vision Tube secured with: ETT holder Comments: Copious tracheal aspirate material consistent with gastric contents.  Intubation performed without sedation or paralysis      MDM:  Imaging/radiology results:  DG Chest Portable 1 View  Result Date: 2022/11/03 CLINICAL DATA:  Intubation. EXAM: PORTABLE CHEST 1 VIEW COMPARISON:  None Available. FINDINGS: The heart size and mediastinal contours are within normal limits. Endotracheal and nasogastric tubes are in grossly good position. Left lung is clear. Mild right infrahilar atelectasis or infiltrate is noted. The visualized skeletal structures are unremarkable. IMPRESSION: Endotracheal and nasogastric tubes are in grossly good position. Mild right infrahilar subsegmental atelectasis or infiltrate. Electronically Signed   By: Lupita Raider M.D.   On: 11/03/22 18:21   DG Abd Portable 1 View  Result Date: November 03, 2022 CLINICAL DATA:  Enteric tube placement EXAM: PORTABLE ABDOMEN - 1 VIEW COMPARISON:  None Available. FINDINGS: Gastric/enteric tube tip projects over the distal stomach. Diffuse gas-filled dilation of small-bowel loops. Intraluminal gas outlines stool within the ascending colon. IMPRESSION: 1. Gastric/enteric tube tip projects over the distal stomach. 2. Diffuse gas-filled dilation of small-bowel loops, which may represent ileus or obstruction. Electronically Signed   By: Agustin Cree M.D.   On: 11/03/2022 18:20    Lab results:  Results for  orders placed or performed during the hospital encounter of 10/14/2022 (from the past 24 hour(s))  CBG monitoring, ED     Status: Abnormal   Collection Time: 10/11/2022  5:41 PM  Result Value Ref Range   Glucose-Capillary 131 (H) 70 - 99 mg/dL  CBC with Differential     Status: None  (Preliminary result)   Collection Time: 09/17/2022  5:59 PM  Result Value Ref Range   WBC 6.4 4.0 - 10.5 K/uL   RBC 4.04 3.87 - 5.11 MIL/uL   Hemoglobin 12.2 12.0 - 15.0 g/dL   HCT 16.1 09.6 - 04.5 %   MCV 95.8 80.0 - 100.0 fL   MCH 30.2 26.0 - 34.0 pg   MCHC 31.5 30.0 - 36.0 g/dL   RDW 40.9 81.1 - 91.4 %   Platelets 311 150 - 400 K/uL   nRBC 0.0 0.0 - 0.2 %   Neutrophils Relative % PENDING %   Neutro Abs PENDING 1.7 - 7.7 K/uL   Band Neutrophils PENDING %   Lymphocytes Relative PENDING %   Lymphs Abs PENDING 0.7 - 4.0 K/uL   Monocytes Relative PENDING %   Monocytes Absolute PENDING 0.1 - 1.0 K/uL   Eosinophils Relative PENDING %   Eosinophils Absolute PENDING 0.0 - 0.5 K/uL   Basophils Relative PENDING %   Basophils Absolute PENDING 0.0 - 0.1 K/uL   WBC Morphology PENDING    RBC Morphology PENDING    Smear Review PENDING    Other PENDING %   nRBC PENDING 0 /100 WBC   Metamyelocytes Relative PENDING %   Myelocytes PENDING %   Promyelocytes Relative PENDING %   Blasts PENDING %   Immature Granulocytes PENDING %   Abs Immature Granulocytes PENDING 0.00 - 0.07 K/uL  Protime-INR     Status: Abnormal   Collection Time: 10/12/2022  5:59 PM  Result Value Ref Range   Prothrombin Time 17.1 (H) 11.4 - 15.2 seconds   INR 1.4 (H) 0.8 - 1.2  APTT     Status: None   Collection Time: 09/27/2022  5:59 PM  Result Value Ref Range   aPTT 33 24 - 36 seconds  I-stat chem 8, ED (not at [facility], [facility] or [facility])     Status: Abnormal   Collection Time: 09/27/2022  6:07 PM  Result Value Ref Range   Sodium 142 135 - 145 mmol/L   Potassium 5.0 3.5 - 5.1 mmol/L   Chloride 113 (H) 98 - 111 mmol/L   BUN 24 (H) 8 - 23 mg/dL   Creatinine, Ser 7.82 (H) 0.44 - 1.00 mg/dL   Glucose, Bld 956 (H) 70 - 99 mg/dL   Calcium, Ion 2.13 0.86 - 1.40 mmol/L   TCO2 17 (L) 22 - 32 mmol/L   Hemoglobin 11.9 (L) 12.0 - 15.0 g/dL   HCT 57.8 (L) 46.9 - 62.9 %  I-Stat venous blood gas, ([facility] ED, [facility], [facility])     Status: Abnormal    Collection Time: 10/09/2022  6:07 PM  Result Value Ref Range   pH, Ven 7.136 (LL) 7.25 - 7.43   pCO2, Ven 45.7 44 - 60 mmHg   pO2, Ven 97 (H) 32 - 45 mmHg   Bicarbonate 15.4 (L) 20.0 - 28.0 mmol/L   TCO2 17 (L) 22 - 32 mmol/L   O2 Saturation 95 %   Acid-base deficit 13.0 (H) 0.0 - 2.0 mmol/L   Sodium 141 135 - 145 mmol/L   Potassium 5.0 3.5 - 5.1 mmol/L  Calcium, Ion 1.13 (L) 1.15 - 1.40 mmol/L   HCT 34.0 (L) 36.0 - 46.0 %   Hemoglobin 11.6 (L) 12.0 - 15.0 g/dL   Sample type VENOUS   Urinalysis, w/ Reflex to Culture (Infection Suspected) -Urine, Clean Catch     Status: Abnormal   Collection Time: 10-12-2022  6:08 PM  Result Value Ref Range   Specimen Source URINE, CLEAN CATCH    Color, Urine AMBER (A) YELLOW   APPearance HAZY (A) CLEAR   Specific Gravity, Urine 1.020 1.005 - 1.030   pH 5.0 5.0 - 8.0   Glucose, UA NEGATIVE NEGATIVE mg/dL   Hgb urine dipstick NEGATIVE NEGATIVE   Bilirubin Urine SMALL (A) NEGATIVE   Ketones, ur 5 (A) NEGATIVE mg/dL   Protein, ur 30 (A) NEGATIVE mg/dL   Nitrite NEGATIVE NEGATIVE   Leukocytes,Ua NEGATIVE NEGATIVE   RBC / HPF 0-5 0 - 5 RBC/hpf   WBC, UA 0-5 0 - 5 WBC/hpf   Bacteria, UA RARE (A) NONE SEEN   Squamous Epithelial / HPF 0-5 0 - 5 /HPF   Mucus PRESENT    Hyaline Casts, UA PRESENT    Granular Casts, UA PRESENT      Key medications administered in the ER:  Medications  naloxone (NARCAN) injection (2 mg Intravenous Given 10-12-22 1741)  phenylephrine (NEO-SYNEPHRINE) injection (20 mg Intravenous Given October 12, 2022 1747)  lactated ringers infusion (has no administration in time range)  metroNIDAZOLE (FLAGYL) IVPB 500 mg (500 mg Intravenous New Bag/Given 10/12/2022 1820)  vancomycin (VANCOREADY) IVPB 1250 mg/250 mL (has no administration in time range)  0.9 %  sodium chloride infusion (250 mLs Intravenous Not Given 10/12/22 1803)  norepinephrine (LEVOPHED) 4mg  in (0.016 mg/mL) premix infusion (40 mcg/min Intravenous Infusion Verify 10-12-2022  1802)  vasopressin (PITRESSIN) 20 Units in sodium chloride 0.9 % 100 mL infusion-*FOR SHOCK* (0.04 Units/min Intravenous New Bag/Given 10/12/22 1823)  lactated ringers bolus 1,000 mL (has no administration in time range)  lactated ringers bolus 1,000 mL (0 mLs Intravenous Stopped October 12, 2022 1822)    And  lactated ringers bolus 1,000 mL (0 mLs Intravenous Stopped 2022-10-12 1822)    And  lactated ringers bolus 1,000 mL (0 mLs Intravenous Stopped 10-12-2022 1822)  ceFEPIme (MAXIPIME) 2 g in sodium chloride 0.9 % 100 mL IVPB (0 g Intravenous Stopped 12-Oct-2022 1822)  acetaminophen (TYLENOL) suppository 650 mg (650 mg Rectal Given Oct 12, 2022 1805)    Consults: Critical care  Medical decision making: -Vital signs initially showed stable blood pressure, tachycardia, and patient febrile to 104.  She is ill-appearing in respiratory distress as above. -Patient's presentation is most consistent with acute presentation with potential threat to life or bodily function.. -Lori Barrett is a 57 y.o. female presenting to the emergency department with altered mental status and respiratory distress.  -Additional history obtained from EMS as above. -On arrival patient tachycardic however initial blood pressure stable.  Patient reportedly found down febrile and tachycardic.  On arrival, additional Narcan given intravenously without response.  Patient with spontaneous respirations being assisted with BVM.  Point-of-care sugar is normal at 131.  Given lack of improvement with Narcan and normal sugar will proceed with intubation.  Intubation performed as above and is notable for evidence of significant aspiration.  Presentation consistent with sepsis therefore sepsis protocol initiated including cultures, antibiotics, fluids, and lactic acid.  Will obtain CT of the head to further evaluate ultimately status for acute intracranial abnormality.  Intubation performed without sitting medications and repeat blood pressures show  dropping pressures and hypotension.  Given 160 mcg of phenylephrine and Levophed initiated.  After uptitrating on Levophed patient persistently hypotensive therefore vasopressin added.  Pressures have been improving on vasopressor support.  Labs obtained including i-STAT VBG which shows metabolic acidosis.  Chest x-ray shows appropriately placed ET tube.  CBC is without leukocytosis.  CMP shows AKI.  CK is normal.  Ethanol normal.  UA without UTI.  Presentation is most consistent with aspiration pneumonia and heat exposure. Have discussed the patient with ICU for admission and they will admit to their service.    Medical Decision Making Amount and/or Complexity of Data Reviewed Labs: ordered. Decision-making details documented in ED Course. Radiology: ordered and independent interpretation performed. Decision-making details documented in ED Course. ECG/medicine tests: ordered.  Risk OTC drugs. Prescription drug management. Decision regarding hospitalization.     The plan for this patient was discussed with Dr. Lockie Mola, who voiced agreement and who oversaw evaluation and treatment of this patient.  Marta Lamas, MD Emergency Medicine, PGY-3  Note: Dragon medical dictation software was used in the creation of this note.   Clinical Impression:  1. Altered mental status, unspecified altered mental status type   2. Hypotension, unspecified hypotension type           Chase Caller, MD 10/04/2022 1841    Virgina Norfolk, DO 09/29/2022 1930

## 2022-10-07 NOTE — Procedures (Signed)
Central Venous Catheter Insertion Procedure Note  Lori Barrett  [id number]  04/18/1876  Date:10/16/22  Time:7:18 PM   Provider Performing:Tylon Kemmerling R Morgon Pamer   Procedure: Insertion of Non-tunneled Central Venous Catheter(36556) with US guidance (40981)   Indication(s) Medication administration  Consent Unable to obtain consent due to emergent nature of procedure.  Anesthesia Topical only with 1% lidocaine   Timeout Verified patient identification, verified procedure, site/side was marked, verified correct patient position, special equipment/implants available, medications/allergies/relevant history reviewed, required imaging and test results available.  Sterile Technique Maximal sterile technique including full sterile barrier drape, hand hygiene, sterile gown, sterile gloves, mask, hair covering, sterile ultrasound probe cover (if used).  Procedure Description Area of catheter insertion was cleaned with chlorhexidine and draped in sterile fashion.  With real-time ultrasound guidance a central venous catheter was placed into the right femoral vein. Nonpulsatile blood flow and easy flushing noted in all ports.  The catheter was sutured in place and sterile dressing applied.  Complications/Tolerance None; patient tolerated the procedure well. Chest X-ray is ordered to verify placement for internal jugular or subclavian cannulation.   Chest x-ray is not ordered for femoral cannulation.  EBL Minimal  Specimen(s) None  [name] Armelia Penton, PA-C

## 2022-10-07 NOTE — Sepsis Progress Note (Signed)
Sepsis protocol monitored by eLink ?

## 2022-10-07 NOTE — Telephone Encounter (Signed)
Pt mother call and stated pt is in a medical crisis and also is missing.

## 2022-10-07 NOTE — Telephone Encounter (Signed)
FYI Pt mother called the  office reported that pt was in jail and left last night and has been missing since then.Pt mother states that the police is aware and are helping, Dr Clent Ridges was notified.

## 2022-10-08 ENCOUNTER — Encounter (HOSPITAL_COMMUNITY): Payer: Self-pay

## 2022-10-08 ENCOUNTER — Other Ambulatory Visit (HOSPITAL_COMMUNITY): Payer: Self-pay

## 2022-10-08 ENCOUNTER — Inpatient Hospital Stay (HOSPITAL_COMMUNITY): Payer: Medicare Other

## 2022-10-08 DIAGNOSIS — I469 Cardiac arrest, cause unspecified: Secondary | ICD-10-CM

## 2022-10-08 LAB — TYPE AND SCREEN
Unit division: 0
Unit division: 0

## 2022-10-08 LAB — BPAM CRYOPRECIPITATE
Blood Product Expiration Date: 202406262359
ISSUE DATE / TIME: 202406220107
Unit Type and Rh: 7300

## 2022-10-08 LAB — BPAM RBC
Blood Product Expiration Date: 202407202359
ISSUE DATE / TIME: 202406220325
Unit Type and Rh: 5100
Unit Type and Rh: 5100

## 2022-10-08 LAB — COMPREHENSIVE METABOLIC PANEL
ALT: 988 U/L — ABNORMAL HIGH (ref 0–44)
AST: 975 U/L — ABNORMAL HIGH (ref 15–41)
Albumin: <1.5 g/dL — ABNORMAL LOW (ref 3.5–5.0)
Alkaline Phosphatase: 46 U/L (ref 38–126)
Anion gap: 31 — ABNORMAL HIGH (ref 5–15)
BUN: 18 mg/dL (ref 6–20)
CO2: 25 mmol/L (ref 22–32)
Calcium: 5.6 mg/dL — CL (ref 8.9–10.3)
Chloride: 104 mmol/L (ref 98–111)
Creatinine, Ser: 2.55 mg/dL — ABNORMAL HIGH (ref 0.44–1.00)
GFR, Estimated: 21 mL/min — ABNORMAL LOW (ref >60)
Glucose, Bld: 223 mg/dL — ABNORMAL HIGH (ref 70–99)
Potassium: 2.5 mmol/L — CL (ref 3.5–5.1)
Sodium: 160 mmol/L — ABNORMAL HIGH (ref 135–145)
Total Bilirubin: 0.7 mg/dL (ref 0.3–1.2)
Total Protein: <3 g/dL — ABNORMAL LOW (ref 6.5–8.1)

## 2022-10-08 LAB — PREPARE CRYOPRECIPITATE

## 2022-10-08 LAB — BPAM FFP
Blood Product Expiration Date: 202406262359
Blood Product Expiration Date: 202406262359
Blood Product Expiration Date: 202406262359
ISSUE DATE / TIME: 202406212355
ISSUE DATE / TIME: 202406212355
ISSUE DATE / TIME: 202406220346
ISSUE DATE / TIME: 202406220346
Unit Type and Rh: 5100
Unit Type and Rh: 5100
Unit Type and Rh: 5100
Unit Type and Rh: 5100

## 2022-10-08 LAB — POCT I-STAT 7, (LYTES, BLD GAS, ICA,H+H)
Acid-base deficit: 5 mmol/L — ABNORMAL HIGH (ref 0.0–2.0)
Bicarbonate: 23.2 mmol/L (ref 20.0–28.0)
Calcium, Ion: 0.8 mmol/L — CL (ref 1.15–1.40)
HCT: 21 % — ABNORMAL LOW (ref 36.0–46.0)
Hemoglobin: 7.1 g/dL — ABNORMAL LOW (ref 12.0–15.0)
O2 Saturation: 56 %
Patient temperature: 32.1
Potassium: 2.5 mmol/L — CL (ref 3.5–5.1)
Sodium: 154 mmol/L — ABNORMAL HIGH (ref 135–145)
TCO2: 25 mmol/L (ref 22–32)
pCO2 arterial: 52.5 mmHg — ABNORMAL HIGH (ref 32–48)
pH, Arterial: 7.224 — ABNORMAL LOW (ref 7.35–7.45)
pO2, Arterial: 27 mmHg — CL (ref 83–108)

## 2022-10-08 LAB — BPAM PLATELET PHERESIS

## 2022-10-08 LAB — PREPARE FRESH FROZEN PLASMA
Unit division: 0
Unit division: 0

## 2022-10-08 LAB — CBC WITH DIFFERENTIAL/PLATELET
Abs Immature Granulocytes: 0.17 10*3/uL — ABNORMAL HIGH (ref 0.00–0.07)
Basophils Absolute: 0 10*3/uL (ref 0.0–0.1)
Basophils Relative: 0 %
Eosinophils Absolute: 0 10*3/uL (ref 0.0–0.5)
Eosinophils Relative: 0 %
HCT: 13.8 % — ABNORMAL LOW (ref 36.0–46.0)
Hemoglobin: 4.2 g/dL — CL (ref 12.0–15.0)
Immature Granulocytes: 2 %
Lymphocytes Relative: 41 %
Lymphs Abs: 3.1 10*3/uL (ref 0.7–4.0)
MCH: 29.8 pg (ref 26.0–34.0)
MCHC: 30.4 g/dL (ref 30.0–36.0)
MCV: 97.9 fL (ref 80.0–100.0)
Monocytes Absolute: 0.3 10*3/uL (ref 0.1–1.0)
Monocytes Relative: 4 %
Neutro Abs: 3.8 10*3/uL (ref 1.7–7.7)
Neutrophils Relative %: 53 %
Platelets: 119 10*3/uL — ABNORMAL LOW (ref 150–400)
RBC: 1.41 MIL/uL — ABNORMAL LOW (ref 3.87–5.11)
RDW: 14.3 % (ref 11.5–15.5)
WBC: 7.4 10*3/uL (ref 4.0–10.5)
nRBC: 2.3 % — ABNORMAL HIGH (ref 0.0–0.2)

## 2022-10-08 LAB — CULTURE, BLOOD (ROUTINE X 2)

## 2022-10-08 LAB — PREPARE RBC (CROSSMATCH)

## 2022-10-08 LAB — LACTIC ACID, PLASMA: Lactic Acid, Venous: >9 mmol/L (ref 0.5–1.9)

## 2022-10-08 LAB — PROCALCITONIN: Procalcitonin: 0.41 ng/mL

## 2022-10-08 LAB — CK TOTAL AND CKMB (NOT AT ARMC)
CK, MB: 88.2 ng/mL — ABNORMAL HIGH (ref 0.5–5.0)
Total CK: 3610 U/L — ABNORMAL HIGH (ref 38–234)

## 2022-10-08 LAB — GLUCOSE, CAPILLARY
Glucose-Capillary: 191 mg/dL — ABNORMAL HIGH (ref 70–99)
Glucose-Capillary: 252 mg/dL — ABNORMAL HIGH (ref 70–99)

## 2022-10-08 LAB — MAGNESIUM: Magnesium: 2.1 mg/dL (ref 1.7–2.4)

## 2022-10-08 LAB — PROTIME-INR
INR: 3.6 — ABNORMAL HIGH (ref 0.8–1.2)
Prothrombin Time: 36.3 seconds — ABNORMAL HIGH (ref 11.4–15.2)

## 2022-10-08 MED ORDER — ORAL CARE MOUTH RINSE
15.0000 mL | OROMUCOSAL | Status: DC
  Administered 2022-10-08 (×2): 15 mL via OROMUCOSAL

## 2022-10-08 MED ORDER — SODIUM BICARBONATE 8.4 % IV SOLN
50.0000 meq | Freq: Once | INTRAVENOUS | Status: AC
  Administered 2022-10-08: 50 meq via INTRAVENOUS

## 2022-10-08 MED ORDER — CALCIUM CHLORIDE 10 % IV SOLN: 1.0000 g | Freq: Once | INTRAVENOUS | Status: AC

## 2022-10-08 MED ORDER — CALCIUM CHLORIDE 10 % IV SOLN: 1.0000 g | Freq: Once | INTRAVENOUS | Status: DC

## 2022-10-08 MED ORDER — SODIUM CHLORIDE 0.9% IV SOLUTION: Freq: Once | INTRAVENOUS | Status: DC

## 2022-10-08 MED ORDER — CALCIUM CHLORIDE 10 % IV SOLN
INTRAVENOUS | Status: AC
  Administered 2022-10-08: 1 g via INTRAVENOUS
  Filled 2022-10-08: qty 20

## 2022-10-08 MED ORDER — ORAL CARE MOUTH RINSE: 15.0000 mL | OROMUCOSAL | Status: DC | PRN

## 2022-10-08 NOTE — Progress Notes (Signed)
CCM update    S: Date of admit Oct 20, 2022 with LOS 1 for today 10/13/2022 : Lori Barrett is  -the mother has refused to come in.  In the interim since the last note patient is continued to deteriorate with multiorgan failure.  She had a significant venous hematoma in the right groin.  This is required FemoStop.  We connected arterial transducer to the central line and confirmed it was indeed venous.  She is progressively more mottled.  She continues to be unresponsive.  She is in refractory shock with Levophed greater than 50 mcg epinephrine at max dose and also vasopressin.  Her labs are significantly abnormal.  Her DIC panel is positive.  Platelets have dropped.  Hemoglobin is dropped.  Lactic acid is persistently high.  Significantly acidotic despite bicarb infusion.  Also with shock liver.   EEG with diffuse encephalopathy.  And just approximately 20 minutes ago she coded    and ROSC was obtained after 2 rounds of epinephrine.                                                                                          O:  Blood pressure (!) 87/34, pulse 73, temperature (!) 91.8 F (33.2 C), resp. rate (!) 22, height 5\' 4"  (1.626 m), weight 68.8 kg, SpO2 97 %.   My post CODE evaluation shows she is moribund   LABS    PULMONARY Recent Labs  Lab 10/20/22 1807 2022/10/20 1928  PHART  --  7.040*  PCO2ART  --  47.5  PO2ART  --  186*  HCO3 15.4* 12.8*  TCO2 17*  17*  17* 14*  O2SAT 95 99    CBC Recent Labs  Lab 10-20-2022 1759 2022/10/20 1807 October 20, 2022 1928 10-20-2022 2033 Oct 20, 2022 2225 09/19/2022 0215  HGB 12.2   < > 8.5*  --  5.5* 4.2*  HCT 38.7   < > 25.0*  --  17.0* 13.8*  WBC 6.4  --   --   --  12.2* 7.4  PLT 311  --   --  100* 31* 119*   < > = values in this interval not displayed.    COAGULATION Recent Labs  Lab 2022/10/20 1759 2022-10-20 2033 09/22/2022 0215  INR 1.4* >10.0* 3.6*    CARDIAC  No results for input(s): "TROPONINI" in the last 168 hours. No results for  input(s): "PROBNP" in the last 168 hours.   CHEMISTRY Recent Labs  Lab 10-20-22 1759 10/20/22 1807 10/20/22 1928 10/09/2022 0215  NA 140 142  141  142 144 160*  K 4.9 5.0  5.0  5.0 4.1 2.5*  CL 109 113*  113*  --  104  CO2 17*  --   --  25  GLUCOSE 140* 136*  136*  --  223*  BUN 23* 24*  24*  --  18  CREATININE 2.63*  2.51* 2.20*  2.20*  --  2.55*  CALCIUM 8.7*  --   --  5.6*  MG 1.9  --   --  2.1   Estimated Creatinine Clearance: 22.9 mL/min (A) (by C-G formula based on SCr of 2.55 mg/dL (  H)).   LIVER Recent Labs  Lab Oct 09, 2022 1759 2022-10-09 2033 10/02/2022 0215  AST 48*  --  975*  ALT 31  --  988*  ALKPHOS 57  --  46  BILITOT 0.9  --  0.7  PROT 6.6  --  <3.0*  ALBUMIN 3.5  --  <1.5*  INR 1.4* >10.0* 3.6*     INFECTIOUS Recent Labs  Lab 09-Oct-2022 1759 2022-10-09 2000 2022-10-09 2033 10/13/2022 0215  LATICACIDVEN 5.9* 3.0*  --  >9.0*  PROCALCITON  --   --  0.13 0.41     ENDOCRINE CBG (last 3)  Recent Labs    10-09-22 1741 09-Oct-2022 1945 10/12/2022 0033  GLUCAP 131* 181* 191*         IMAGING x24h  - image(s) personally visualized  -   highlighted in bold EEG adult  Result Date: October 09, 2022 Charlsie Quest, MD     10-09-22 10:36 PM Patient Name: Lori Barrett MRN: [id number] Epilepsy Attending: Charlsie Quest Referring Physician/Provider: Kalman Shan, MD Date: 10-09-22 Duration: 25.33 mins Patient history: unknown year old female with ams getting eeg to evaluate for seizure Level of alertness:  lethargic AEDs during EEG study: None Technical aspects: This EEG study was done with scalp electrodes positioned according to the 10-20 International system of electrode placement. Electrical activity was reviewed with band pass filter of 1-70Hz , sensitivity of 7 uV/mm, display speed of 66mm/sec with a 60Hz  notched filter applied as appropriate. EEG data were recorded continuously and digitally stored.  Video monitoring was available and reviewed as  appropriate. Description: EEG showed continuous generalized low amplitude 2-3hz  delta slowing.  Hyperventilation and photic stimulation were not performed.  Of note, EEG was technically difficult due to significant myogenic and ventilator artifact.  ABNORMALITY - Continuous slow, generalized IMPRESSION: This technically difficult study is suggestive of severe diffuse encephalopathy, nonspecific etiology. No seizures or epileptiform discharges were seen throughout the recording. Charlsie Quest   DG Chest Portable 1 View  Result Date: 10/09/22 CLINICAL DATA:  Intubation. EXAM: PORTABLE CHEST 1 VIEW COMPARISON:  None Available. FINDINGS: The heart size and mediastinal contours are within normal limits. Endotracheal and nasogastric tubes are in grossly good position. Left lung is clear. Mild right infrahilar atelectasis or infiltrate is noted. The visualized skeletal structures are unremarkable. IMPRESSION: Endotracheal and nasogastric tubes are in grossly good position. Mild right infrahilar subsegmental atelectasis or infiltrate. Electronically Signed   By: Lupita Raider M.D.   On: Oct 09, 2022 18:21   DG Abd Portable 1 View  Result Date: 10-09-22 CLINICAL DATA:  Enteric tube placement EXAM: PORTABLE ABDOMEN - 1 VIEW COMPARISON:  None Available. FINDINGS: Gastric/enteric tube tip projects over the distal stomach. Diffuse gas-filled dilation of small-bowel loops. Intraluminal gas outlines stool within the ascending colon. IMPRESSION: 1. Gastric/enteric tube tip projects over the distal stomach. 2. Diffuse gas-filled dilation of small-bowel loops, which may represent ileus or obstruction. Electronically Signed   By: Agustin Cree M.D.   On: 2022/10/09 18:20      A: Multiorgan failure Acute respiratory failure, acute renal failure, circulatory shock, severe acidosis, shock liver, DIC Status post PEA arrest Severe depletion of hemoglobin and getting transfused.  Also getting other blood products.  P:  Continue present regimen and full ICU care She will likely will code again. This illness is not something she will survive  The mom is refusing to come in.   Anti-infectives (From admission, onward)    Start     Dose/Rate  Route Frequency Ordered Stop   October 13, 2022 0600  cefTRIAXone (ROCEPHIN) 2 g in sodium chloride 0.9 % 100 mL IVPB        2 g 200 mL/hr over 30 Minutes Intravenous Every 24 hours 10/12/2022 1844     09/18/2022 2200  doxycycline (VIBRAMYCIN) 100 mg in sodium chloride 0.9 % 250 mL IVPB        100 mg 125 mL/hr over 120 Minutes Intravenous Every 12 hours 09/22/2022 2018     10/05/2022 2025  vancomycin variable dose per unstable renal function (pharmacist dosing)         Does not apply See admin instructions 10/01/2022 2026     09/20/2022 1800  ceFEPIme (MAXIPIME) 2 g in sodium chloride 0.9 % 100 mL IVPB        2 g 200 mL/hr over 30 Minutes Intravenous  Once 09/30/2022 1753 10/13/2022 1822   09/26/2022 1800  metroNIDAZOLE (FLAGYL) IVPB 500 mg        500 mg 100 mL/hr over 60 Minutes Intravenous  Once 10/04/2022 1753 09/21/2022 1928   10/10/2022 1800  vancomycin (VANCOCIN) IVPB 1000 mg/200 mL premix  Status:  Discontinued        1,000 mg 200 mL/hr over 60 Minutes Intravenous  Once 10/09/2022 1753 09/22/2022 1755   10/12/2022 1800  vancomycin (VANCOREADY) IVPB 1250 mg/250 mL        1,250 mg 166.7 mL/hr over 90 Minutes Intravenous  Once 09/27/2022 1755 10/07/22 2005        Rest per NP/medical resident whose note is outlined above and that I agree with  The patient is critically ill with multiple organ systems failure and requires high complexity decision making for assessment and support, frequent evaluation and titration of therapies, application of advanced monitoring technologies and extensive interpretation of multiple databases.   Critical Care Time devoted to patient care services described in this note is  60  Minutes. This time reflects time of care of this signee Dr Kalman Shan. This  critical care time does not reflect procedure time, or teaching time or supervisory time of PA/NP/Med student/Med Resident etc but could involve care discussion time     Dr. Kalman Shan, M.D., [facility].C.P Pulmonary and Critical Care Medicine Staff Physician [facility] [place] [facility] Pager: [phone], If no answer or between  15:00h - 7:00h: call [phone]  October 13, 2022 3:56 AM

## 2022-10-08 NOTE — Progress Notes (Signed)
   11-05-2022 0400  Spiritual Encounters  Type of Visit Initial  Care provided to: Patient  Conversation partners present during encounter Nurse  Referral source Code page  Reason for visit Code  OnCall Visit Yes   Ch responded to code blue page. There was no family at bedside. Ch provided emotional and spiritual support to care team. No follow-up needed at this time.

## 2022-10-08 NOTE — Progress Notes (Signed)
Echocardiogram was cancelled per Dr. Royal Piedra RVT RCS

## 2022-10-08 NOTE — Progress Notes (Signed)
PCCM progress note   Continued decline with cardiac arrest this morning. Remains on maximal vasopressors and severely coagulopathic with no clearly reversible condition. Patient is actively dying and will not benefit from further CPR as trauma from the latter will only serve to worsen ongoing bleeding from DIC,  DNR on medical basis, as have not been able to obtain reliable guidance from family. Continue current aggressive care for now but without further escalation in intervention.   Lynnell Catalan, MD [facility] ICU Physician [facility] [place] [facility]  Pager: [phone] Or Epic Secure Chat After hours: [phone].  10/01/2022, 8:05 AM

## 2022-10-08 NOTE — H&P (Addendum)
NAME:  Lori Barrett, MRN:  [id number], DOB:  07-22-1965, LOS: 1 ADMISSION DATE:  2022/10/14, CONSULTATION DATE:  October 14, 2022 REFERRING MD:  Dr. Saundra Shelling - EDP, CHIEF COMPLAINT:  Shock   History of Present Illness:  Lori Barrett is a unidentified adult female of unknown age or past medical history who presented to the ED via EMS after she was found unresponsive outside jail gates.  It is unknown how long patient was unresponsive and down prior to EMS arrival.  It is believed the patient was recently released from jail because she was found outside jail.  She received intranasal Narcan and route with minimal response.  This was repeated on ED arrival with no improvement in mentation.  GCS 3 on ED arrival resulting in emergent intubation, no sedation or paralytic was required.  On ED arrival patient was seen severely hypothermic with Tmax of 105.8, tachypneic, significantly tachycardic, and progressively hypotensive.  At time of admission evaluation vast majority of laboratory workup was still pending.  I-STAT chemistry with creatinine 2.20 and hemoglobin 11.6. Dr Randa Evens placed aline. Dr Marchelle Gearing evaluated patien in ICU . AT that time 100% Fio2, peep 8,  breathing over ventilator, has cough, gag  mottled, bloody secretiions via ET tube, Rt forehad laceration. Unresponive without sedation. On pressors in ICU . . UDS positive for benzo  Massive sloughing of skin consisten with sun burn on upper back  Pertinent  Medical History  Unknown  Significant Hospital Events: Including procedures, antibiotic start and stop dates in addition to other pertinent events   10-14-2022 found down with unknown downtime, septic shock suspected on arrival 6/22 worsening shock and coagulopathy overnight.  Bleeding from central access sites.  Markedly coagulopathic.  PEA arrest around 3:30 AM. 6/22 finally were able to reach family they understand the gravity but have not come into the hospital.  Interim History / Subjective:    Continues to have oozing from venous access sites.  Blood pressure stuck in the 60s despite maximal vasopressor therapy. Objective   Blood pressure (!) 109/55, pulse 74, temperature (!) 89.4 F (31.9 C), temperature source Bladder, resp. rate (!) 28, height 5\' 4"  (1.626 m), weight 68.8 kg, SpO2 97 %. CVP:  [16 mmHg] 16 mmHg  Vent Mode: PRVC FiO2 (%):  [100 %] 100 % Set Rate:  [18 bmp-28 bmp] 28 bmp Vt Set:  [430 mL] 430 mL PEEP:  [5 cmH20] 5 cmH20 Plateau Pressure:  [24 cmH20-31 cmH20] 24 cmH20   Intake/Output Summary (Last 24 hours) at 09/25/2022 1348 Last data filed at 10/07/2022 1300 Gross per 24 hour  Intake 15706.88 ml  Output 200 ml  Net 15506.88 ml    Filed Weights   2022-10-14 2000 09/17/2022 0500  Weight: 68.8 kg 68.8 kg    Examination: General Appearance:  Looks criticall ill  Head:  Normocephalic, without obvious abnormality, atraumatic Eyes:  PERRL - yes, conjunctiva/corneas - muddy     Ears:  Normal external ear canals, both ears Nose:  G tube - no Throat:  ETT TUBE - yes , OG tube - yes Neck:  Supple,  No enlargement/tenderness/nodules Lungs: Clear to auscultation bilaterally, not overbreathing ventilator Heart:  S1 and S2 normal, no murmur, CVP - no.  Pressors - levophed and vasopressin Abdomen:  Soft, no masses, no organomegaly Genitalia / Rectal:  Not done Extremities:  Extremities- intact but mottled and dusk skin sloughing.  Marked flank discoloration.  Bleeding from IV site Skin:  ntact in exposed areas . Sacral area -  not examined Neurologic:  Sedation - none  No response to stimulation.  Ancillary tests personally reviewed   ABG shows severe hypoxia Sodium now 154 creatinine 2.55 lactic acid remains greater than 9 Hemoglobin nadir of 4.2 Platelet nadir 31 Troponin greater than 24,000 D-dimer greater than 220 INR greater than 10 corrected to 3.6 with FFP.  Assessment & Plan:   Presented with severe heatstroke with temperature greater than  106 Possible unknown drug ingestion Refractory distributive circulatory shock Acute Respiratory failure due to encephalopathy and acidosis Acute obtunded encephalopathy with hyper-pyrexia. No neck rigidity Undifferentiated circulatory shock at admi: Clinical suspicion high for likely severe septic shock in the setting of aspiration pneumonia but unable to rule out cardiogenic or distributive shock as well Severe DIC AKI at admit due to dehydration and shock - at admit Severe metabolic and lactic acidosis - aat admit  Plan:  -Patient is in refractory shock with hypotension not consistent with a survivable state despite maximum doses of vasopressors. -I have made her a medical DNR based on this as previously documented. -I have attempted to call the mother back at the number listed in the chart which corresponds to the phone number used last night but there is no answer. -I think it is only a matter of time before she arrested again and succumbs to her severe shock.  Best Practice (right click and "Reselect all SmartList Selections" daily)   Diet/type: NPO DVT prophylaxis: SCD GI prophylaxis: PPI Lines: N/A Foley:  N/A Code Status:  full code Last date of multidisciplinary goals of care discussion: Family reached mother and explained to her that the patient is dying.  She will attempt to come in as soon as possible.  CRITICAL CARE Performed by: Lynnell Catalan   Total critical care time: 32 minutes  Critical care time was exclusive of separately billable procedures and treating other patients.  Critical care was necessary to treat or prevent imminent or life-threatening deterioration.  Critical care was time spent personally by me on the following activities: development of treatment plan with patient and/or surrogate as well as nursing, discussions with consultants, evaluation of patient's response to treatment, examination of patient, obtaining history from patient or surrogate,  ordering and performing treatments and interventions, ordering and review of laboratory studies, ordering and review of radiographic studies, pulse oximetry, re-evaluation of patient's condition and participation in multidisciplinary rounds.  Lynnell Catalan, MD [facility] ICU Physician [facility] [place] [facility]  Pager: [phone] Mobile: [phone] After hours: [phone].

## 2022-10-08 NOTE — Progress Notes (Signed)
RT responded to code blue, unable to place ETCO2 on ETT due to patient regaining pulses once arrived to bedside. RN and CCM aware.

## 2022-10-08 NOTE — Progress Notes (Signed)
TXA is being held at this time, pts blood and secretions are none to minimal. RN aware. RT will continue to monitor.

## 2022-10-08 NOTE — Progress Notes (Signed)
Pronouncement of death  Asystole on monitor.  Time of death 1535.  Lynnell Catalan, MD [facility] ICU Physician [facility] [place] [facility]  Pager: [phone] Or Epic Secure Chat After hours: [phone].  09/29/2022, 3:40 PM

## 2022-10-08 NOTE — Progress Notes (Signed)
eLink Physician-Brief Progress Note Patient Name: Lori Barrett DOB: 03-26-1966 MRN: [id number]   Date of Service  09/17/2022  HPI/Events of Note  Patient extremely critically ill with DIC and multiple organ systems failure s/p cardiac arrest with unknown down time, she is also high risk for anoxic encephalopathy. Given the dire nature of her presentation she is at very high risk of re-arresting , and if she does go into cardiac arrest she is at high risk of dying despite attempt at resuscitation.  eICU Interventions          Migdalia Dk 10/04/2022, 12:26 AM

## 2022-10-08 NOTE — Progress Notes (Signed)
eLink Physician-Brief Progress Note Patient Name: Lori Barrett DOB: 1966/03/15 MRN: [id number]   Date of Service  2022/11/07  HPI/Events of Note  Hemoglobin 4.2 gm / dl, PT-INR 3.6, platelets 119 K.  eICU Interventions  Orders entered for 4 units PRBC and 2 units FFP, blood bank requested to stay 4 units ahead on PRBC's.        Lori Barrett 2022-11-07, 3:16 AM

## 2022-10-09 LAB — BPAM RBC
Blood Product Expiration Date: 202407202359
Blood Product Expiration Date: 202407202359
Blood Product Expiration Date: 202407202359
Blood Product Expiration Date: 202407202359
Blood Product Expiration Date: 202407202359
Blood Product Expiration Date: 202407202359
Blood Product Expiration Date: 202407202359
Blood Product Expiration Date: 202407202359
Blood Product Expiration Date: 202407202359
ISSUE DATE / TIME: 202406212344
ISSUE DATE / TIME: 202406212344
ISSUE DATE / TIME: 202406220325
ISSUE DATE / TIME: 202406220325
ISSUE DATE / TIME: 202406220325
Unit Type and Rh: 5100
Unit Type and Rh: 5100
Unit Type and Rh: 5100
Unit Type and Rh: 5100
Unit Type and Rh: 5100
Unit Type and Rh: 5100
Unit Type and Rh: 5100
Unit Type and Rh: 5100

## 2022-10-09 LAB — TYPE AND SCREEN
Antibody Screen: NEGATIVE
Unit division: 0
Unit division: 0
Unit division: 0
Unit division: 0
Unit division: 0
Unit division: 0
Unit division: 0
Unit division: 0

## 2022-10-09 LAB — PREPARE PLATELET PHERESIS
Unit division: 0
Unit division: 0

## 2022-10-09 LAB — PREPARE FRESH FROZEN PLASMA: Unit division: 0

## 2022-10-09 LAB — CULTURE, BLOOD (ROUTINE X 2)

## 2022-10-09 LAB — BPAM FFP
Blood Product Expiration Date: 202406272359
Blood Product Expiration Date: 202406272359
Unit Type and Rh: 5100
Unit Type and Rh: 5100

## 2022-10-09 LAB — PREPARE CRYOPRECIPITATE: Unit division: 0

## 2022-10-09 LAB — BPAM PLATELET PHERESIS
Blood Product Expiration Date: 202406222359
Blood Product Expiration Date: 202406232359
ISSUE DATE / TIME: 202406220107
ISSUE DATE / TIME: 202406220107
Unit Type and Rh: 6200
Unit Type and Rh: 6200

## 2022-10-09 LAB — BPAM CRYOPRECIPITATE
Blood Product Expiration Date: 202406232359
ISSUE DATE / TIME: 202406220107
Unit Type and Rh: 5100

## 2022-10-10 LAB — PATHOLOGIST SMEAR REVIEW

## 2022-10-11 LAB — CULTURE, BLOOD (ROUTINE X 2)

## 2022-10-12 LAB — CULTURE, BLOOD (ROUTINE X 2)
Culture: NO GROWTH
Culture: NO GROWTH

## 2022-10-13 LAB — SPOTTED FEVER GROUP ANTIBODIES
Spotted Fever Group IgG: <1:64 {titer}
Spotted Fever Group IgM: <1:64 {titer}

## 2022-10-17 DEATH — deceased

## 2022-11-17 NOTE — Discharge Summary (Signed)
DEATH SUMMARY   Patient Details  Name: Lori Barrett MRN: [id number] DOB: 1965/06/03  Admission/Discharge Information   Admit Date:  2022-10-29  Date of Death: Date of Death: 10/30/22  Time of Death: Time of Death: 13-Nov-1541  Length of Stay: 1  Referring Physician: Patient, No Pcp Per   Reason(s) for Hospitalization  Heat stroke  Diagnoses  Preliminary cause of death: Multi-system organ failure Secondary Diagnoses (including complications and co-morbidities):  Principal Problem:   Heat stroke ARDS Acute hypoxic and hypercarbic respiratory failure Coagulopathy due to DIC AKI  Lactic acidosis.   Brief Hospital Course (including significant findings, care, treatment, and services provided and events leading to death)  Lori Barrett is a 57 y.o. year old female who was found unresponsive outside jail after having been released several hours earlier. No improvement with Narcan. Intubated for GCS 3 in ED with negative CT head. Found to have temperature of 106F. Started on external cooling and given IV fluids for severe hypotension. Started on pressors for persistent shock despite 6L of fluids. Very difficult to ventilate due to ARDS with pink frothy sputum. Persistent coagulopathy with bleeding from central line/arterial line site. Continued hypotension with PEA cardiac arrest. First attempt to contact family, they didn't seem to understand her situation. We were able to contact them to come in the next morning. By this time she had been persistently hypotensive in 60's despite maximal doses of vasopressors, fluids and blood products. She arrested again just before the family's arrival and given that she was already on maximal therapy, she was not subjected to CPR to avoid further trauma and was allowed to pass away peacefully.   Pertinent Labs and Studies  Significant Diagnostic Studies EEG adult  Result Date: 10-29-2022 Charlsie Quest, MD     Oct 29, 2022 10:36 PM Patient Name: Lori Barrett MRN: [id number] Epilepsy Attending: Charlsie Quest Referring Physician/Provider: Kalman Shan, MD Date: 10/29/2022 Duration: 25.33 mins Patient history: unknown year old female with ams getting eeg to evaluate for seizure Level of alertness:  lethargic AEDs during EEG study: None Technical aspects: This EEG study was done with scalp electrodes positioned according to the 10-20 International system of electrode placement. Electrical activity was reviewed with band pass filter of 1-70Hz , sensitivity of 7 uV/mm, display speed of 26mm/sec with a 60Hz  notched filter applied as appropriate. EEG data were recorded continuously and digitally stored.  Video monitoring was available and reviewed as appropriate. Description: EEG showed continuous generalized low amplitude 2-3hz  delta slowing.  Hyperventilation and photic stimulation were not performed.  Of note, EEG was technically difficult due to significant myogenic and ventilator artifact.  ABNORMALITY - Continuous slow, generalized IMPRESSION: This technically difficult study is suggestive of severe diffuse encephalopathy, nonspecific etiology. No seizures or epileptiform discharges were seen throughout the recording. Charlsie Quest   DG Chest Portable 1 View  Result Date: October 29, 2022 CLINICAL DATA:  Intubation. EXAM: PORTABLE CHEST 1 VIEW COMPARISON:  None Available. FINDINGS: The heart size and mediastinal contours are within normal limits. Endotracheal and nasogastric tubes are in grossly good position. Left lung is clear. Mild right infrahilar atelectasis or infiltrate is noted. The visualized skeletal structures are unremarkable. IMPRESSION: Endotracheal and nasogastric tubes are in grossly good position. Mild right infrahilar subsegmental atelectasis or infiltrate. Electronically Signed   By: Lupita Raider M.D.   On: 29-Oct-2022 18:21   DG Abd Portable 1 View  Result Date: 10-29-2022 CLINICAL DATA:  Enteric tube placement EXAM: PORTABLE ABDOMEN  -  1 VIEW COMPARISON:  None Available. FINDINGS: Gastric/enteric tube tip projects over the distal stomach. Diffuse gas-filled dilation of small-bowel loops. Intraluminal gas outlines stool within the ascending colon. IMPRESSION: 1. Gastric/enteric tube tip projects over the distal stomach. 2. Diffuse gas-filled dilation of small-bowel loops, which may represent ileus or obstruction. Electronically Signed   By: Agustin Cree M.D.   On: 10/06/2022 18:20    Microbiology Recent Results (from the past 240 hour(s))  Resp panel by RT-PCR (RSV, Flu A&B, Covid) Anterior Nasal Swab     Status: None   Collection Time: 09/18/2022  5:53 PM   Specimen: Anterior Nasal Swab  Result Value Ref Range Status   SARS Coronavirus 2 by RT PCR NEGATIVE NEGATIVE Final   Influenza A by PCR NEGATIVE NEGATIVE Final   Influenza B by PCR NEGATIVE NEGATIVE Final    Comment: (NOTE) The Xpert Xpress SARS-CoV-2/FLU/RSV plus assay is intended as an aid in the diagnosis of influenza from Nasopharyngeal swab specimens and should not be used as a sole basis for treatment. Nasal washings and aspirates are unacceptable for Xpert Xpress SARS-CoV-2/FLU/RSV testing.  Fact Sheet for Patients: BloggerCourse.com  Fact Sheet for Healthcare Providers: SeriousBroker.it  This test is not yet approved or cleared by the [place] FDA and has been authorized for detection and/or diagnosis of SARS-CoV-2 by FDA under an Emergency Use Authorization (EUA). This EUA will remain in effect (meaning this test can be used) for the duration of the COVID-19 declaration under Section 564(b)(1) of the Act, 21 U.S.C. section 360bbb-3(b)(1), unless the authorization is terminated or revoked.     Resp Syncytial Virus by PCR NEGATIVE NEGATIVE Final    Comment: (NOTE) Fact Sheet for Patients: BloggerCourse.com  Fact Sheet for Healthcare  Providers: SeriousBroker.it  This test is not yet approved or cleared by the [place] FDA and has been authorized for detection and/or diagnosis of SARS-CoV-2 by FDA under an Emergency Use Authorization (EUA). This EUA will remain in effect (meaning this test can be used) for the duration of the COVID-19 declaration under Section 564(b)(1) of the Act, 21 U.S.C. section 360bbb-3(b)(1), unless the authorization is terminated or revoked.  Performed at [facility], [address], [place], [place] [address]   Blood Culture (routine x 2)     Status: None   Collection Time: 10/02/2022  5:58 PM   Specimen: BLOOD RIGHT HAND  Result Value Ref Range Status   Specimen Description BLOOD RIGHT HAND  Final   Special Requests   Final    BOTTLES DRAWN AEROBIC AND ANAEROBIC Blood Culture results may not be optimal due to an inadequate volume of blood received in culture bottles   Culture   Final    NO GROWTH 5 DAYS Performed at [facility], [address] [place], [place] [address]    Report Status 10/12/2022 FINAL  Final  Blood Culture (routine x 2)     Status: None   Collection Time: 09/19/2022  5:59 PM   Specimen: BLOOD  Result Value Ref Range Status   Specimen Description BLOOD RIGHT ANTECUBITAL  Final   Special Requests   Final    BOTTLES DRAWN AEROBIC AND ANAEROBIC Blood Culture results may not be optimal due to an excessive volume of blood received in culture bottles   Culture   Final    NO GROWTH 5 DAYS Performed at [facility], [address], [place], [place] [address]    Report Status 10/12/2022 FINAL  Final  MRSA Next Gen by PCR, Nasal     Status: None   Collection Time: 09/25/2022  7:49 PM   Specimen: Nasal Mucosa; Nasal Swab  Result Value Ref Range Status   MRSA by PCR Next Gen NOT DETECTED NOT DETECTED Final    Comment: (NOTE) The GeneXpert MRSA Assay (FDA approved for NASAL specimens only), is one component of a comprehensive  MRSA colonization surveillance program. It is not intended to diagnose MRSA infection nor to guide or monitor treatment for MRSA infections. Test performance is not FDA approved in patients less than 11 years old. Performed at [facility], [address], [place], [place] [address]   Respiratory (~20 pathogens) panel by PCR     Status: None   Collection Time: 09/21/2022  8:48 PM   Specimen: Nasopharyngeal Swab; Respiratory  Result Value Ref Range Status   Adenovirus NOT DETECTED NOT DETECTED Final   Coronavirus 229E NOT DETECTED NOT DETECTED Final    Comment: (NOTE) The Coronavirus on the Respiratory Panel, DOES NOT test for the novel  Coronavirus (2019 nCoV)    Coronavirus HKU1 NOT DETECTED NOT DETECTED Final   Coronavirus NL63 NOT DETECTED NOT DETECTED Final   Coronavirus OC43 NOT DETECTED NOT DETECTED Final   Metapneumovirus NOT DETECTED NOT DETECTED Final   Rhinovirus / Enterovirus NOT DETECTED NOT DETECTED Final   Influenza A NOT DETECTED NOT DETECTED Final   Influenza B NOT DETECTED NOT DETECTED Final   Parainfluenza Virus 1 NOT DETECTED NOT DETECTED Final   Parainfluenza Virus 2 NOT DETECTED NOT DETECTED Final   Parainfluenza Virus 3 NOT DETECTED NOT DETECTED Final   Parainfluenza Virus 4 NOT DETECTED NOT DETECTED Final   Respiratory Syncytial Virus NOT DETECTED NOT DETECTED Final   Bordetella pertussis NOT DETECTED NOT DETECTED Final   Bordetella Parapertussis NOT DETECTED NOT DETECTED Final   Chlamydophila pneumoniae NOT DETECTED NOT DETECTED Final   Mycoplasma pneumoniae NOT DETECTED NOT DETECTED Final    Comment: Performed at [facility], [address], [place], [place] [address]    Lab Basic Metabolic Panel: No results for input(s): "NA", "K", "CL", "CO2", "GLUCOSE", "BUN", "CREATININE", "CALCIUM", "MG", "PHOS" in the last 168 hours. Liver Function Tests: No results for input(s): "AST", "ALT", "ALKPHOS", "BILITOT", "PROT", "ALBUMIN" in the last 168  hours. No results for input(s): "LIPASE", "AMYLASE" in the last 168 hours. No results for input(s): "AMMONIA" in the last 168 hours. CBC: No results for input(s): "WBC", "NEUTROABS", "HGB", "HCT", "MCV", "PLT" in the last 168 hours. Cardiac Enzymes: No results for input(s): "CKTOTAL", "CKMB", "CKMBINDEX", "TROPONINI" in the last 168 hours. Sepsis Labs: No results for input(s): "PROCALCITON", "WBC", "LATICACIDVEN" in the last 168 hours.  Procedures/Operations  Mechanical ventilation, central line placement.    Jaylean Buenaventura 10/17/2022, 8:13 AM
